# Patient Record
Sex: Male | Born: 1985 | Race: White | Hispanic: No | Marital: Married | Smoking: Current every day smoker
Health system: Southern US, Community
[De-identification: ages and names within clinical notes are randomized; demographics above are authoritative.]

## PROBLEM LIST (undated history)

## (undated) DIAGNOSIS — J45909 Unspecified asthma, uncomplicated: Secondary | ICD-10-CM

## (undated) DIAGNOSIS — F319 Bipolar disorder, unspecified: Secondary | ICD-10-CM

## (undated) HISTORY — PX: GANGLION CYST EXCISION: SHX1691

## (undated) HISTORY — PX: COSMETIC SURGERY: SHX468

## (undated) HISTORY — PX: HERNIA REPAIR: SHX51

---
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
## 2003-12-25 ENCOUNTER — Emergency Department (HOSPITAL_COMMUNITY): Admission: EM | Admit: 2003-12-25 | Discharge: 2003-12-25 | Payer: Self-pay | Admitting: *Deleted

## 2004-05-13 ENCOUNTER — Emergency Department (HOSPITAL_COMMUNITY): Admission: EM | Admit: 2004-05-13 | Discharge: 2004-05-13 | Payer: Self-pay | Admitting: Emergency Medicine

## 2004-05-26 ENCOUNTER — Emergency Department (HOSPITAL_COMMUNITY): Admission: EM | Admit: 2004-05-26 | Discharge: 2004-05-26 | Payer: Self-pay | Admitting: Emergency Medicine

## 2004-06-18 ENCOUNTER — Emergency Department (HOSPITAL_COMMUNITY): Admission: EM | Admit: 2004-06-18 | Discharge: 2004-06-18 | Payer: Self-pay | Admitting: Emergency Medicine

## 2004-06-30 ENCOUNTER — Emergency Department (HOSPITAL_COMMUNITY): Admission: EM | Admit: 2004-06-30 | Discharge: 2004-06-30 | Payer: Self-pay | Admitting: Emergency Medicine

## 2004-07-09 ENCOUNTER — Emergency Department (HOSPITAL_COMMUNITY): Admission: EM | Admit: 2004-07-09 | Discharge: 2004-07-09 | Payer: Self-pay | Admitting: Emergency Medicine

## 2004-12-16 ENCOUNTER — Emergency Department (HOSPITAL_COMMUNITY): Admission: EM | Admit: 2004-12-16 | Discharge: 2004-12-16 | Payer: Self-pay | Admitting: Emergency Medicine

## 2005-11-04 ENCOUNTER — Emergency Department (HOSPITAL_COMMUNITY): Admission: EM | Admit: 2005-11-04 | Discharge: 2005-11-04 | Payer: Self-pay | Admitting: Emergency Medicine

## 2005-11-25 ENCOUNTER — Inpatient Hospital Stay (HOSPITAL_COMMUNITY): Admission: EM | Admit: 2005-11-25 | Discharge: 2005-11-26 | Payer: Self-pay | Admitting: Emergency Medicine

## 2006-03-20 ENCOUNTER — Emergency Department (HOSPITAL_COMMUNITY): Admission: EM | Admit: 2006-03-20 | Discharge: 2006-03-20 | Payer: Self-pay | Admitting: Emergency Medicine

## 2006-08-15 ENCOUNTER — Emergency Department (HOSPITAL_COMMUNITY): Admission: EM | Admit: 2006-08-15 | Discharge: 2006-08-16 | Payer: Self-pay | Admitting: Emergency Medicine

## 2006-09-12 ENCOUNTER — Emergency Department (HOSPITAL_COMMUNITY): Admission: EM | Admit: 2006-09-12 | Discharge: 2006-09-12 | Payer: Self-pay | Admitting: Emergency Medicine

## 2006-09-13 ENCOUNTER — Observation Stay (HOSPITAL_COMMUNITY): Admission: EM | Admit: 2006-09-13 | Discharge: 2006-09-13 | Payer: Self-pay | Admitting: Emergency Medicine

## 2007-06-09 ENCOUNTER — Emergency Department (HOSPITAL_COMMUNITY): Admission: EM | Admit: 2007-06-09 | Discharge: 2007-06-09 | Payer: Self-pay | Admitting: Family Medicine

## 2007-06-28 ENCOUNTER — Emergency Department (HOSPITAL_COMMUNITY): Admission: EM | Admit: 2007-06-28 | Discharge: 2007-06-28 | Payer: Self-pay | Admitting: Emergency Medicine

## 2007-10-07 ENCOUNTER — Emergency Department (HOSPITAL_COMMUNITY): Admission: EM | Admit: 2007-10-07 | Discharge: 2007-10-08 | Payer: Self-pay | Admitting: Emergency Medicine

## 2007-10-09 ENCOUNTER — Emergency Department (HOSPITAL_COMMUNITY): Admission: EM | Admit: 2007-10-09 | Discharge: 2007-10-09 | Payer: Self-pay | Admitting: Emergency Medicine

## 2007-11-10 ENCOUNTER — Emergency Department (HOSPITAL_COMMUNITY): Admission: EM | Admit: 2007-11-10 | Discharge: 2007-11-10 | Payer: Self-pay | Admitting: Emergency Medicine

## 2007-11-29 ENCOUNTER — Emergency Department (HOSPITAL_COMMUNITY): Admission: EM | Admit: 2007-11-29 | Discharge: 2007-11-29 | Payer: Self-pay | Admitting: Emergency Medicine

## 2007-12-13 ENCOUNTER — Emergency Department (HOSPITAL_COMMUNITY): Admission: EM | Admit: 2007-12-13 | Discharge: 2007-12-13 | Payer: Self-pay | Admitting: Emergency Medicine

## 2007-12-30 ENCOUNTER — Encounter: Admission: RE | Admit: 2007-12-30 | Discharge: 2007-12-30 | Payer: Self-pay | Admitting: Internal Medicine

## 2008-10-04 ENCOUNTER — Emergency Department (HOSPITAL_COMMUNITY): Admission: EM | Admit: 2008-10-04 | Discharge: 2008-10-04 | Payer: Self-pay | Admitting: Family Medicine

## 2009-01-04 ENCOUNTER — Emergency Department (HOSPITAL_COMMUNITY): Admission: EM | Admit: 2009-01-04 | Discharge: 2009-01-04 | Payer: Self-pay | Admitting: Emergency Medicine

## 2009-01-09 ENCOUNTER — Emergency Department (HOSPITAL_COMMUNITY): Admission: EM | Admit: 2009-01-09 | Discharge: 2009-01-09 | Payer: Self-pay | Admitting: Family Medicine

## 2009-01-11 ENCOUNTER — Emergency Department (HOSPITAL_COMMUNITY): Admission: EM | Admit: 2009-01-11 | Discharge: 2009-01-11 | Payer: Self-pay | Admitting: Emergency Medicine

## 2009-04-06 ENCOUNTER — Emergency Department (HOSPITAL_COMMUNITY): Admission: EM | Admit: 2009-04-06 | Discharge: 2009-04-06 | Payer: Self-pay | Admitting: Emergency Medicine

## 2009-06-29 ENCOUNTER — Emergency Department (HOSPITAL_COMMUNITY): Admission: EM | Admit: 2009-06-29 | Discharge: 2009-06-29 | Payer: Self-pay | Admitting: Family Medicine

## 2009-07-02 ENCOUNTER — Emergency Department (HOSPITAL_COMMUNITY): Admission: EM | Admit: 2009-07-02 | Discharge: 2009-07-02 | Payer: Self-pay | Admitting: Emergency Medicine

## 2009-11-27 ENCOUNTER — Emergency Department (HOSPITAL_COMMUNITY): Admission: EM | Admit: 2009-11-27 | Discharge: 2009-11-28 | Payer: Self-pay | Admitting: Emergency Medicine

## 2010-02-21 ENCOUNTER — Emergency Department (HOSPITAL_COMMUNITY): Admission: EM | Admit: 2010-02-21 | Discharge: 2009-08-31 | Payer: Self-pay | Admitting: Emergency Medicine

## 2010-03-14 ENCOUNTER — Emergency Department (HOSPITAL_COMMUNITY)
Admission: EM | Admit: 2010-03-14 | Discharge: 2010-03-14 | Payer: Self-pay | Source: Home / Self Care | Admitting: Emergency Medicine

## 2010-05-30 LAB — URINE CULTURE

## 2010-05-30 LAB — URINE MICROSCOPIC-ADD ON

## 2010-05-30 LAB — URINALYSIS, ROUTINE W REFLEX MICROSCOPIC
Glucose, UA: NEGATIVE mg/dL
Hgb urine dipstick: NEGATIVE
Specific Gravity, Urine: 1.017 (ref 1.005–1.030)
Urobilinogen, UA: 1 mg/dL (ref 0.0–1.0)

## 2010-06-04 LAB — POCT URINALYSIS DIP (DEVICE)
Glucose, UA: NEGATIVE mg/dL
Nitrite: NEGATIVE
Protein, ur: 30 mg/dL — AB
Urobilinogen, UA: 0.2 mg/dL (ref 0.0–1.0)

## 2010-06-04 LAB — GC/CHLAMYDIA PROBE AMP, GENITAL: Chlamydia, DNA Probe: NEGATIVE
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
## 2010-08-02 NOTE — Op Note (Signed)
[place]  Patient:    Christopher [name], MIERZWA Visit Number: [id number] MRN: [id number]          Service Type: DSU Location: [facility] Attending Physician:  Marlowe Shores Dictated by:   Artist Pais Mina Marble, M.D. Proc. Date: 04/14/01 Admit Date:  04/14/2001                             Operative Report  PREOPERATIVE DIAGNOSES: 1. Right wrist lunotriquetral interosseous ligament tear. 2. Right wrist dorsal ganglion cyst.  POSTOPERATIVE DIAGNOSES: 1. Right wrist lunotriquetral interosseous ligament tear. 2. Right wrist dorsal ganglion cyst.  PROCEDURE: 1. Right wrist arthroscopy with debridement of lunotriquetral    interosseous ligament tear. 2. Open cyst excision ulnar aspect dorsal wrist.  SURGEON:  Artist Pais. Mina Marble, M.D.  ASSISTANT:  Junius Roads. Ireton, P.A.C.  ANESTHESIA:  General.  TOURNIQUET TIME:  40 minutes.  COMPLICATIONS:  None.  DRAINS:  None.  OPERATIVE REPORT:  The patient was taken to the operating room.  After induction of adequate general anesthesia the right upper extremity was prepped and draped in the usual sterile fashion.  An Esmarch bandage was used to exsanguinate the limb.  The tourniquet was inflated to 250 mmHg at this point and time.  The right hand and wrist were placed in a Concept retraction chair well-padded with 12 pounds of counter traction placed across the radial carpal joint.  Once this was done, the standard 3/4 arthroscopic portal was established 1 cm distal to Listers tubercle.  Blunt dissection was carried down through the skin and the capsule was pierced bluntly.  The scope was inserted in the radial carpal joint.  Visualization of the radial side of the joint revealed intact radioscaphocapitate long radial ligaments and intact scaphointerosseous ligament and fat pad.  At this point and time, the ulnar side of this was visualized using 18 gauge needle and outflow portal was established in the 6U  position with the 18 gauge needle.  A 4/5 working portal was then established under direct vision.  Visualization of the ulnar side of the wrist revealed intact TFCC complex but obvious lunotriquetrial interosseous ligament tear and ulnar synovitis.  Next, using combination of suction shaver, 2.9 mm and the arthrocare wand, an ulnar synovectomy and L3 debridement was undertaken.  Once this was done, the rest of the joint was visualized and partial radial synovectomy was performed. The instruments were then removed from the wrist and a 3 cm incision was made over the dorsal ulnar aspect of the wrist where a large cyst was encountered overlying the 4th and 5th dorsal compartments.  This was carefully dissected down to the stalk and excised in its entirety.  This wound was then closed with running 3-0 Prolene subcuticular stitch.  The arthroscopic portals were 4-0 Nylon and the patient had Marcaine injected for postoperative pain control.  The patient was then placed in sterile dressing with Xeroform, 4x4 fluffs and volar splint.  The patient tolerated the procedure well and went to the recovery room in stable condition. Dictated by:   Artist Pais Mina Marble, M.D. Attending Physician:  Marlowe Shores DD:  04/14/01 TD:  04/14/01 Job: [id number]

## 2011-01-01 LAB — URINALYSIS, ROUTINE W REFLEX MICROSCOPIC
Glucose, UA: NEGATIVE
Ketones, ur: NEGATIVE
Nitrite: NEGATIVE
Specific Gravity, Urine: 1.006
pH: 6

## 2011-01-01 LAB — I-STAT 8, (EC8 V) (CONVERTED LAB)
Chloride: 105
HCT: 50
Hemoglobin: 17
Operator id: 192351
Potassium: 3.8
Sodium: 140
TCO2: 27

## 2011-01-01 LAB — POCT I-STAT CREATININE
Creatinine, Ser: 0.8
Operator id: 192351

## 2011-01-01 LAB — ETHANOL: Alcohol, Ethyl (B): 115 — ABNORMAL HIGH

## 2011-11-25 DIAGNOSIS — F17200 Nicotine dependence, unspecified, uncomplicated: Secondary | ICD-10-CM | POA: Insufficient documentation

## 2011-11-25 DIAGNOSIS — N390 Urinary tract infection, site not specified: Secondary | ICD-10-CM | POA: Insufficient documentation

## 2011-11-25 DIAGNOSIS — I861 Scrotal varices: Secondary | ICD-10-CM | POA: Insufficient documentation

## 2011-11-25 DIAGNOSIS — F319 Bipolar disorder, unspecified: Secondary | ICD-10-CM | POA: Insufficient documentation

## 2011-11-25 DIAGNOSIS — J45909 Unspecified asthma, uncomplicated: Secondary | ICD-10-CM | POA: Insufficient documentation

## 2011-11-26 ENCOUNTER — Emergency Department (HOSPITAL_COMMUNITY): Payer: Medicaid Other

## 2011-11-26 ENCOUNTER — Encounter (HOSPITAL_COMMUNITY): Payer: Self-pay

## 2011-11-26 ENCOUNTER — Emergency Department (HOSPITAL_COMMUNITY)
Admission: EM | Admit: 2011-11-26 | Discharge: 2011-11-26 | Disposition: A | Discharge status: Home / Self Care | Payer: Medicaid Other | Attending: Emergency Medicine | Admitting: Emergency Medicine

## 2011-11-26 DIAGNOSIS — I861 Scrotal varices: Secondary | ICD-10-CM

## 2011-11-26 DIAGNOSIS — N390 Urinary tract infection, site not specified: Secondary | ICD-10-CM

## 2011-11-26 HISTORY — DX: Bipolar disorder, unspecified: F31.9

## 2011-11-26 HISTORY — DX: Unspecified asthma, uncomplicated: J45.909

## 2011-11-26 LAB — URINALYSIS, MICROSCOPIC ONLY
Bilirubin Urine: NEGATIVE
Hgb urine dipstick: NEGATIVE
Ketones, ur: NEGATIVE mg/dL
Nitrite: NEGATIVE
Urobilinogen, UA: 0.2 mg/dL (ref 0.0–1.0)

## 2011-11-26 LAB — CBC WITH DIFFERENTIAL/PLATELET
Eosinophils Absolute: 0.1 10*3/uL (ref 0.0–0.7)
Hemoglobin: 16.2 g/dL (ref 13.0–17.0)
Lymphocytes Relative: 43 % (ref 12–46)
Lymphs Abs: 4.1 10*3/uL — ABNORMAL HIGH (ref 0.7–4.0)
MCH: 30.2 pg (ref 26.0–34.0)
MCV: 84 fL (ref 78.0–100.0)
Monocytes Relative: 9 % (ref 3–12)
Neutrophils Relative %: 46 % (ref 43–77)
Platelets: 277 10*3/uL (ref 150–400)
RBC: 5.37 MIL/uL (ref 4.22–5.81)
WBC: 9.6 10*3/uL (ref 4.0–10.5)

## 2011-11-26 LAB — BASIC METABOLIC PANEL
BUN: 11 mg/dL (ref 6–23)
CO2: 25 mEq/L (ref 19–32)
Glucose, Bld: 84 mg/dL (ref 70–99)
Potassium: 3.7 mEq/L (ref 3.5–5.1)
Sodium: 136 mEq/L (ref 135–145)

## 2011-11-26 MED ORDER — ONDANSETRON HCL 4 MG/2ML IJ SOLN: 4.0000 mg | Freq: Once | INTRAMUSCULAR | Status: DC

## 2011-11-26 MED ORDER — MORPHINE SULFATE 4 MG/ML IJ SOLN: 4.0000 mg | Freq: Once | INTRAMUSCULAR | Status: DC

## 2011-11-26 MED ORDER — OXYCODONE-ACETAMINOPHEN 5-325 MG PO TABS
2.0000 | ORAL_TABLET | Freq: Once | ORAL | Status: AC
  Administered 2011-11-26: 2 via ORAL
  Filled 2011-11-26: qty 2

## 2011-11-26 MED ORDER — HYDROCODONE-ACETAMINOPHEN 5-500 MG PO TABS: 1.0000 | ORAL_TABLET | Freq: Four times a day (QID) | ORAL | Status: AC | PRN

## 2011-11-26 MED ORDER — CIPROFLOXACIN HCL 500 MG PO TABS: 500.0000 mg | ORAL_TABLET | Freq: Two times a day (BID) | ORAL | Status: AC

## 2011-11-26 MED ORDER — ONDANSETRON 4 MG PO TBDP
ORAL_TABLET | ORAL | Status: AC
  Administered 2011-11-26: 4 mg via ORAL
  Filled 2011-11-26: qty 1

## 2011-11-26 MED ORDER — ONDANSETRON 4 MG PO TBDP
4.0000 mg | ORAL_TABLET | Freq: Once | ORAL | Status: AC
  Administered 2011-11-26: 4 mg via ORAL

## 2011-11-26 NOTE — ED Provider Notes (Signed)
History     CSN: [id number]  Arrival date & time 11/25/11  2354   First MD Initiated Contact with Patient 11/26/11 0358      Chief Complaint  Patient presents with  . Abdominal Pain    (Consider location/radiation/quality/duration/timing/severity/associated sxs/prior treatment) HPI  Patient presents to the emergency department with complaints of pain to his left scrotum for the past week. He states that he normally doesn't feel his testicles very much but they have been hurting her recently. He denies having any change in color or feeling any masses. He states that his left lower quadrant hurts a little bit as well. He denies having any nausea vomiting, diarrhea, fevers, weakness, chills. He denies having this problem in the past. Pt hemodynamically stable.  Past Medical History  Diagnosis Date  . Asthma   . Bipolar 1 disorder     Past Surgical History  Procedure Date  . Hernia repair   . Ganglion cyst excision   . Cosmetic surgery     face    History reviewed. No pertinent family history.  History  Substance Use Topics  . Smoking status: Current Every Day Smoker -- 1.0 packs/day  . Smokeless tobacco: Not on file  . Alcohol Use: No      Review of Systems   Review of Systems  Gen: no weight loss, fevers, chills, night sweats  Eyes: no discharge or drainage, no occular pain or visual changes  Nose: no epistaxis or rhinorrhea  Mouth: no dental pain, no sore throat  Neck: no neck pain  Lungs:No wheezing, coughing or hemoptysis CV: no chest pain, palpitations, dependent edema or orthopnea  Abd: + LLQ abdominal pain, - nausea, vomiting  GU: no dysuria or gross hematuria, pain to the testicles MSK:  No abnormalities  Neuro: no headache, no focal neurologic deficits  Skin: no abnormalities Psyche: negative.    Allergies  Review of patient's allergies indicates no known allergies.  Home Medications   Current Outpatient Rx  Name Route Sig Dispense Refill    . HYDROCODONE-ACETAMINOPHEN 5-500 MG PO TABS Oral Take 1-2 tablets by mouth every 6 (six) hours as needed for pain. 15 tablet 0    BP 122/68  Pulse 68  Resp 18  SpO2 98%  Physical Exam  Nursing note and vitals reviewed. Constitutional: He appears well-developed and well-nourished. No distress.  HENT:  Head: Normocephalic and atraumatic.  Eyes: Pupils are equal, round, and reactive to light.  Neck: Normal range of motion. Neck supple.  Cardiovascular: Normal rate and regular rhythm.   Pulmonary/Chest: Effort normal.  Abdominal: Soft. He exhibits no distension and no mass. There is tenderness (mild suprapubic and LLQ tenderness). There is no rebound and no guarding.  Neurological: He is alert.  Skin: Skin is warm and dry.    ED Course  Procedures (including critical care time)  Labs Reviewed  URINALYSIS, WITH MICROSCOPIC - Abnormal; Notable for the following:    APPearance HAZY (*)     Leukocytes, UA MODERATE (*)     Bacteria, UA FEW (*)     All other components within normal limits  CBC WITH DIFFERENTIAL - Abnormal; Notable for the following:    Lymphs Abs 4.1 (*)     All other components within normal limits  BASIC METABOLIC PANEL  URINE CULTURE   US Scrotum  11/26/2011  *RADIOLOGY REPORT*  Clinical Data:  Left testicular pain for 1 week.  No trauma or fevers.  SCROTAL ULTRASOUND DOPPLER ULTRASOUND OF THE TESTICLES  Technique: Complete ultrasound examination of the testicles, epididymis, and other scrotal structures was performed.  Color and spectral Doppler ultrasound were also utilized to evaluate blood flow to the testicles.  Comparison:  None.  Findings:  Right testis:  The right testis measures 5 x 3.1 x 3.2 cm.  Normal homogeneous parenchymal echotexture.  Small focal calcification. Color-flow Doppler images demonstrate flow within the testis.  No focal mass lesion.  Left testis:  The left testis measures 4.3 x 2.4 x 1.6 cm.  Normal homogeneous parenchymal echotexture.   Color flow Doppler images demonstrate flow within the testis.  No focal mass lesion.  Right epididymis:  Normal in size and appearance.  Left epididymis:  Normal in size and appearance.  Hydrocele:  Bilateral scrotal hydroceles.  Varicocele:  Left scrotal varicoceles is present.  Pulsed Doppler interrogation of both testes demonstrates low resistance flow bilaterally.  IMPRESSION: No testicular mass or torsion identified.  Bilateral scrotal hydroceles.  Left scrotal varicoceles.   Original Report Authenticated By: Marlon Pel, M.D.    [place] Art/ven Flow Abd Pelv Doppler  11/26/2011  *RADIOLOGY REPORT*  Clinical Data:  Left testicular pain for 1 week.  No trauma or fevers.  SCROTAL ULTRASOUND DOPPLER ULTRASOUND OF THE TESTICLES  Technique: Complete ultrasound examination of the testicles, epididymis, and other scrotal structures was performed.  Color and spectral Doppler ultrasound were also utilized to evaluate blood flow to the testicles.  Comparison:  None.  Findings:  Right testis:  The right testis measures 5 x 3.1 x 3.2 cm.  Normal homogeneous parenchymal echotexture.  Small focal calcification. Color-flow Doppler images demonstrate flow within the testis.  No focal mass lesion.  Left testis:  The left testis measures 4.3 x 2.4 x 1.6 cm.  Normal homogeneous parenchymal echotexture.  Color flow Doppler images demonstrate flow within the testis.  No focal mass lesion.  Right epididymis:  Normal in size and appearance.  Left epididymis:  Normal in size and appearance.  Hydrocele:  Bilateral scrotal hydroceles.  Varicocele:  Left scrotal varicoceles is present.  Pulsed Doppler interrogation of both testes demonstrates low resistance flow bilaterally.  IMPRESSION: No testicular mass or torsion identified.  Bilateral scrotal hydroceles.  Left scrotal varicoceles.   Original Report Authenticated By: Marlon Pel, M.D.      1. UTI (lower urinary tract infection)   2. Left varicocele       MDM    Concerned that the patient may have epididymitis. A scrotal ultrasound has been ordered with Dopplers. If the ultrasound comes back negative I think that the patient may need a abdominal CT scan to evaluate for possible kidney stone or other intraabdominal pathology.  Pt does have UTI and positive ultrasound for varicocele. Treatment is symptomatic. Referral to urology.         Dorthula Matas, PA 11/26/11 0558  Dorthula Matas, PA 11/26/11 [phone]

## 2011-11-26 NOTE — ED Provider Notes (Signed)
Medical screening examination/treatment/procedure(s) were performed by non-physician practitioner and as supervising physician I was immediately available for consultation/collaboration.  Richardean Canal, MD 11/26/11 0730

## 2011-11-27 LAB — GC/CHLAMYDIA PROBE AMP, URINE: GC Probe Amp, Urine: NEGATIVE

## 2011-11-28 LAB — URINE CULTURE: Colony Count: >=100000

## 2011-11-29 NOTE — ED Notes (Signed)
+  Urine. Patient treated with Cipro. Sensitive to same. Per protocol MD. °

## 2013-05-06 ENCOUNTER — Emergency Department (HOSPITAL_COMMUNITY): Payer: Medicaid Other

## 2013-05-06 ENCOUNTER — Encounter (HOSPITAL_COMMUNITY): Payer: Self-pay | Admitting: Emergency Medicine

## 2013-05-06 ENCOUNTER — Emergency Department (HOSPITAL_COMMUNITY)
Admission: EM | Admit: 2013-05-06 | Discharge: 2013-05-07 | Disposition: A | Discharge status: Home / Self Care | Payer: Medicaid Other | Attending: Emergency Medicine | Admitting: Emergency Medicine

## 2013-05-06 DIAGNOSIS — R05 Cough: Secondary | ICD-10-CM

## 2013-05-06 DIAGNOSIS — R059 Cough, unspecified: Secondary | ICD-10-CM

## 2013-05-06 DIAGNOSIS — Y929 Unspecified place or not applicable: Secondary | ICD-10-CM | POA: Insufficient documentation

## 2013-05-06 DIAGNOSIS — S3120XA Unspecified open wound of penis, initial encounter: Secondary | ICD-10-CM | POA: Insufficient documentation

## 2013-05-06 DIAGNOSIS — F17200 Nicotine dependence, unspecified, uncomplicated: Secondary | ICD-10-CM | POA: Insufficient documentation

## 2013-05-06 DIAGNOSIS — W268XXA Contact with other sharp object(s), not elsewhere classified, initial encounter: Secondary | ICD-10-CM | POA: Insufficient documentation

## 2013-05-06 DIAGNOSIS — Z8659 Personal history of other mental and behavioral disorders: Secondary | ICD-10-CM | POA: Insufficient documentation

## 2013-05-06 DIAGNOSIS — Z23 Encounter for immunization: Secondary | ICD-10-CM | POA: Insufficient documentation

## 2013-05-06 DIAGNOSIS — Y9389 Activity, other specified: Secondary | ICD-10-CM | POA: Insufficient documentation

## 2013-05-06 DIAGNOSIS — J45909 Unspecified asthma, uncomplicated: Secondary | ICD-10-CM | POA: Insufficient documentation

## 2013-05-06 DIAGNOSIS — S3994XA Unspecified injury of external genitals, initial encounter: Secondary | ICD-10-CM

## 2013-05-06 LAB — CBC WITH DIFFERENTIAL/PLATELET
BASOS ABS: 0 10*3/uL (ref 0.0–0.1)
Basophils Relative: 0 % (ref 0–1)
Eosinophils Absolute: 0.2 10*3/uL (ref 0.0–0.7)
Eosinophils Relative: 1 % (ref 0–5)
HCT: 47.5 % (ref 39.0–52.0)
Hemoglobin: 17.4 g/dL — ABNORMAL HIGH (ref 13.0–17.0)
LYMPHS PCT: 15 % (ref 12–46)
Lymphs Abs: 3 10*3/uL (ref 0.7–4.0)
MCH: 31.1 pg (ref 26.0–34.0)
MCHC: 36.6 g/dL — ABNORMAL HIGH (ref 30.0–36.0)
MCV: 85 fL (ref 78.0–100.0)
MONOS PCT: 6 % (ref 3–12)
Monocytes Absolute: 1.2 10*3/uL — ABNORMAL HIGH (ref 0.1–1.0)
NEUTROS PCT: 78 % — AB (ref 43–77)
Neutro Abs: 15.4 10*3/uL — ABNORMAL HIGH (ref 1.7–7.7)
PLATELETS: 271 10*3/uL (ref 150–400)
RBC: 5.59 MIL/uL (ref 4.22–5.81)
RDW: 12.3 % (ref 11.5–15.5)
WBC: 19.8 10*3/uL — AB (ref 4.0–10.5)

## 2013-05-06 LAB — COMPREHENSIVE METABOLIC PANEL
ALBUMIN: 4.5 g/dL (ref 3.5–5.2)
ALK PHOS: 84 U/L (ref 39–117)
ALT: 29 U/L (ref 0–53)
AST: 20 U/L (ref 0–37)
BILIRUBIN TOTAL: 0.6 mg/dL (ref 0.3–1.2)
BUN: 10 mg/dL (ref 6–23)
CHLORIDE: 100 meq/L (ref 96–112)
CO2: 26 mEq/L (ref 19–32)
CREATININE: 0.83 mg/dL (ref 0.50–1.35)
Calcium: 10.7 mg/dL — ABNORMAL HIGH (ref 8.4–10.5)
GFR calc Af Amer: >90 mL/min (ref >90)
GFR calc non Af Amer: >90 mL/min (ref >90)
Glucose, Bld: 103 mg/dL — ABNORMAL HIGH (ref 70–99)
POTASSIUM: 4.5 meq/L (ref 3.7–5.3)
Sodium: 139 mEq/L (ref 137–147)
Total Protein: 7.8 g/dL (ref 6.0–8.3)

## 2013-05-06 LAB — URINALYSIS, ROUTINE W REFLEX MICROSCOPIC
Bilirubin Urine: NEGATIVE
GLUCOSE, UA: NEGATIVE mg/dL
Hgb urine dipstick: NEGATIVE
KETONES UR: NEGATIVE mg/dL
LEUKOCYTES UA: NEGATIVE
Nitrite: NEGATIVE
PH: 6 (ref 5.0–8.0)
Protein, ur: NEGATIVE mg/dL
Specific Gravity, Urine: 1.023 (ref 1.005–1.030)
Urobilinogen, UA: 0.2 mg/dL (ref 0.0–1.0)

## 2013-05-06 LAB — LIPASE, BLOOD: Lipase: 17 U/L (ref 11–59)

## 2013-05-06 MED ORDER — TETANUS-DIPHTH-ACELL PERTUSSIS 5-2.5-18.5 LF-MCG/0.5 IM SUSP
0.5000 mL | Freq: Once | INTRAMUSCULAR | Status: AC
  Administered 2013-05-06: 0.5 mL via INTRAMUSCULAR
  Filled 2013-05-06: qty 0.5

## 2013-05-06 MED ORDER — GUAIFENESIN 100 MG/5ML PO LIQD
100.0000 mg | ORAL | Status: DC | PRN
Start: 2013-05-06 — End: 2013-11-07

## 2013-05-06 NOTE — ED Provider Notes (Signed)
Medical screening examination/treatment/procedure(s) were performed by non-physician practitioner and as supervising physician I was immediately available for consultation/collaboration.  EKG Interpretation   None         Rolan BuccoMelanie Amal Renbarger, MD 05/06/13 2345
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
## 2013-05-06 NOTE — Discharge Instructions (Signed)
Cough, Adult  A cough is a reflex that helps clear your throat and airways. It can help heal the body or may be a reaction to an irritated airway. A cough may only last 2 or 3 weeks (acute) or may last more than 8 weeks (chronic).  CAUSES Acute cough:  Viral or bacterial infections. Chronic cough:  Infections.  Allergies.  Asthma.  Post-nasal drip.  Smoking.  Heartburn or acid reflux.  Some medicines.  Chronic lung problems (COPD).  Cancer. SYMPTOMS   Cough.  Fever.  Chest pain.  Increased breathing rate.  High-pitched whistling sound when breathing (wheezing).  Colored mucus that you cough up (sputum). TREATMENT   A bacterial cough may be treated with antibiotic medicine.  A viral cough must run its course and will not respond to antibiotics.  Your caregiver may recommend other treatments if you have a chronic cough. HOME CARE INSTRUCTIONS   Only take over-the-counter or prescription medicines for pain, discomfort, or fever as directed by your caregiver. Use cough suppressants only as directed by your caregiver.  Use a cold steam vaporizer or humidifier in your bedroom or home to help loosen secretions.  Sleep in a semi-upright position if your cough is worse at night.  Rest as needed.  Stop smoking if you smoke. SEEK IMMEDIATE MEDICAL CARE IF:   You have pus in your sputum.  Your cough starts to worsen.  You cannot control your cough with suppressants and are losing sleep.  You begin coughing up blood.  You have difficulty breathing.  You develop pain which is getting worse or is uncontrolled with medicine.  You have a fever. MAKE SURE YOU:   Understand these instructions.  Will watch your condition.  Will get help right away if you are not doing well or get worse. Document Released: 08/30/2010 Document Revised: 05/26/2011 Document Reviewed: 08/30/2010 [facility] Patient Information 2014 [employer] [place]. Puncture Wound A puncture wound  is an injury that extends through all layers of the skin and into the tissue beneath the skin (subcutaneous tissue). Puncture wounds become infected easily because germs often enter the body and go beneath the skin during the injury. Having a deep wound with a small entrance point makes it difficult for your caregiver to adequately clean the wound. This is especially true if you have stepped on a nail and it has passed through a dirty shoe or other situations where the wound is obviously contaminated. CAUSES  Many puncture wounds involve glass, nails, splinters, fish hooks, or other objects that enter the skin (foreign bodies). A puncture wound may also be caused by a human bite or animal bite. DIAGNOSIS  A puncture wound is usually diagnosed by your history and a physical exam. You may need to have an X-ray or an ultrasound to check for any foreign bodies still in the wound. TREATMENT   Your caregiver will clean the wound as thoroughly as possible. Depending on the location of the wound, a bandage (dressing) may be applied.  Your caregiver might prescribe antibiotic medicines.  You may need a follow-up visit to check on your wound. Follow all instructions as directed by your caregiver. HOME CARE INSTRUCTIONS   Change your dressing once per day, or as directed by your caregiver. If the dressing sticks, it may be removed by soaking the area in water.  If your caregiver has given you follow-up instructions, it is very important that you return for a follow-up appointment. Not following up as directed could result in a  chronic or permanent injury, pain, and disability.  Only take over-the-counter or prescription medicines for pain, discomfort, or fever as directed by your caregiver.  If you are given antibiotics, take them as directed. Finish them even if you start to feel better. You may need a tetanus shot if:  You cannot remember when you had your last tetanus shot.  You have never had a  tetanus shot. If you got a tetanus shot, your arm may swell, get red, and feel warm to the touch. This is common and not a problem. If you need a tetanus shot and you choose not to have one, there is a rare chance of getting tetanus. Sickness from tetanus can be serious. You may need a rabies shot if an animal bite caused your puncture wound. SEEK MEDICAL CARE IF:   You have redness, swelling, or increasing pain in the wound.  You have red streaks going away from the wound.  You notice a bad smell coming from the wound or dressing.  You have yellowish-white fluid (pus) coming from the wound.  You are treated with an antibiotic for infection, but the infection is not getting better.  You notice something in the wound, such as rubber from your shoe, cloth, or another object.  You have a fever.  You have severe pain.  You have difficulty breathing.  You feel dizzy or faint.  You cannot stop vomiting.  You lose feeling, develop numbness, or cannot move a limb below the wound.  Your symptoms worsen. MAKE SURE YOU:  Understand these instructions.  Will watch your condition.  Will get help right away if you are not doing well or get worse. Document Released: 12/11/2004 Document Revised: 05/26/2011 Document Reviewed: 08/20/2010 [facility] Patient Information 2014 [place], [place].

## 2013-11-07 ENCOUNTER — Emergency Department (HOSPITAL_COMMUNITY)
Admission: EM | Admit: 2013-11-07 | Discharge: 2013-11-07 | Disposition: A | Discharge status: Home / Self Care | Payer: Medicaid Other | Attending: Emergency Medicine | Admitting: Emergency Medicine

## 2013-11-07 ENCOUNTER — Encounter (HOSPITAL_COMMUNITY): Payer: Self-pay | Admitting: Emergency Medicine

## 2013-11-07 DIAGNOSIS — K029 Dental caries, unspecified: Secondary | ICD-10-CM | POA: Diagnosis not present

## 2013-11-07 DIAGNOSIS — K0889 Other specified disorders of teeth and supporting structures: Secondary | ICD-10-CM

## 2013-11-07 DIAGNOSIS — K089 Disorder of teeth and supporting structures, unspecified: Secondary | ICD-10-CM | POA: Diagnosis present

## 2013-11-07 DIAGNOSIS — F17200 Nicotine dependence, unspecified, uncomplicated: Secondary | ICD-10-CM | POA: Diagnosis not present

## 2013-11-07 DIAGNOSIS — J45909 Unspecified asthma, uncomplicated: Secondary | ICD-10-CM | POA: Diagnosis not present

## 2013-11-07 MED ORDER — OXYCODONE-ACETAMINOPHEN 5-325 MG PO TABS
1.0000 | ORAL_TABLET | Freq: Once | ORAL | Status: AC
  Administered 2013-11-07: 1 via ORAL
  Filled 2013-11-07: qty 1

## 2013-11-07 MED ORDER — IBUPROFEN 800 MG PO TABS
800.0000 mg | ORAL_TABLET | Freq: Three times a day (TID) | ORAL | Status: DC
Start: 2013-11-07 — End: 2015-03-28

## 2013-11-07 MED ORDER — OXYCODONE-ACETAMINOPHEN 5-325 MG PO TABS: 1.0000 | ORAL_TABLET | ORAL | Status: DC | PRN

## 2013-11-07 MED ORDER — PENICILLIN V POTASSIUM 500 MG PO TABS: 500.0000 mg | ORAL_TABLET | Freq: Four times a day (QID) | ORAL | Status: AC

## 2013-11-07 NOTE — ED Notes (Signed)
Pt reports left upper dental pain x 1 month. States he had follow-up and they were unable to remove tooth. Pt in NAD.

## 2013-11-07 NOTE — Discharge Instructions (Signed)
You have a dental injury. Use the resource guide listed below to help you find a dentist if you do not already have one to followup with. It is very important that you get evaluated by a dentist as soon as possible. Call tomorrow to schedule an appointment. Use your pain medication as prescribed and do not operate heavy machinery while on pain medication. Note that your pain medication contains acetaminophen (Tylenol) & its is not reccommended that you use additional acetaminophen (Tylenol) while taking this medication. Take your full course of antibiotics. Read the instructions below.  Eat a soft or liquid diet and rinse your mouth out after meals with warm water. You should see a dentist or return here at once if you have increased swelling, increased pain or uncontrolled bleeding from the site of your injury.   SEEK MEDICAL CARE IF:   You have increased pain not controlled with medicines.   You have swelling around your tooth, in your face or neck.   You have bleeding which starts, continues, or gets worse.   You have a fever >101  If you are unable to open your mouth  RESOURCE GUIDE  Dental Problems  Patients with Medicaid: [facility]                     [facility] [address]                                           [address] Phone:  [phone]                                                  Phone:  [phone]  If unable to pay or uninsured, contact:  [facility] or [facility] to become qualified for the adult dental clinic.  Chronic Pain Problems Contact [facility]  [phone] Patients need to be referred by their primary care doctor.  Insufficient Money for Medicine Contact [employer]:  call "211" or [facility] 271-5999.  No Primary Care Doctor Call [facility]  832-8000 Other agencies that provide inexpensive medical care    [facility]  832-8035    [place]  [facility]  832-7272    [facility]  [phone]    Women's Clinic  832-4777    [facility]  [phone]    [facility]  [phone]  Psychological Services [facility]  832-9600 [facility]  [phone] [facility]   800 853-5163 (emergency services 641-4993)  Substance Abuse Resources [facility]  336-882-2125 [facility] 336-784-9470 [facility] 336-285-9073 [facility] 336-845-3988 Residential & Outpatient Substance Abuse Program  800-659-3381  Abuse/Neglect [place] Child Abuse Hotline (336) 641-3795 [place] Child Abuse Hotline 800-378-5315 (After Hours)  Emergency Shelter [facility] (336) 271-5985  [facility] (336) 275-9566 [facility] (704) 372-4663  MRSA Hotline #:   832-7006    [place] Resources  [facility] [place]     [employer]                            [facility] [address] [place]                       [address]      [address]  [place]                                                [place]                            [place] Phone:  [phone]                                   Phone:  [phone]                 Phone:  [phone]  [facility] Phone:  [phone]  [place] Child Abuse Hotline (336) 342-1394 (336) 342-3537 (After Hours)    

## 2013-11-07 NOTE — ED Provider Notes (Signed)
CSN: [id number]     Arrival date & time 11/07/13  2003 History   First MD Initiated Contact with Patient 11/07/13 2017     This chart was scribed for non-physician practitioner, Mellody Drown PA-C working with Flint Melter, MD by Arlan Organ, ED Scribe. This patient was seen in room TR07C/TR07C and the patient's care was started at 8:20 PM.   Chief Complaint  Patient presents with  . Dental Pain   HPI Comments: Christopher [name] is a 28 y.o. male who presents to the Emergency Department complaining of L upper dental pain. Pt states pain has been present for approximately 1 month, but has recently worsened in last 2 days. He admits to cracking his tooth approximately 1 month ago. Pain is exacerbated with eating, drinking, wind intolerance, and hot/cold temperature intolerance. The patient was previously evaluated for same dental pain and referred to a dentist. He states he was not evaluated at the time due to insurance issues. He has tried OTC Orajel, BC powder, and Chloraseptic oral spray without any improvement for symptoms. At this time he denies any fever or chills. No known allergies to medications. No other concerns this visit.  The history is provided by the patient. No language interpreter was used.    Past Medical History  Diagnosis Date  . Asthma   . Bipolar 1 disorder    Past Surgical History  Procedure Laterality Date  . Hernia repair    . Ganglion cyst excision    . Cosmetic surgery      face   No family history on file. History  Substance Use Topics  . Smoking status: Current Every Day Smoker -- 1.00 packs/day  . Smokeless tobacco: Not on file  . Alcohol Use: No    Review of Systems  Constitutional: Negative for fever and chills.  HENT: Positive for dental problem. Negative for facial swelling.   All other systems reviewed and are negative.     Allergies  Review of patient's allergies indicates no known allergies.  Home Medications   Prior to Admission  medications   Medication Sig Start Date End Date Taking? Authorizing Provider  guaiFENesin (ROBITUSSIN) 100 MG/5ML liquid Take 5-10 mLs (100-200 mg total) by mouth every 4 (four) hours as needed for cough. 05/06/13   Roxy Horseman, PA-C   Triage Vitals: BP 135/80  Pulse 97  Temp(Src) 98.5 F (36.9 C) (Oral)  Resp 18  SpO2 100%   Physical Exam  Nursing note and vitals reviewed. Constitutional: He is oriented to person, place, and time. He appears well-developed and well-nourished.  Non-toxic appearance. He does not have a sickly appearance. He does not appear ill. No distress.  HENT:  Head: Normocephalic and atraumatic.  Mouth/Throat: Uvula is midline, oropharynx is clear and moist and mucous membranes are normal. Mucous membranes are not dry. No trismus in the jaw. Abnormal dentition. Dental caries present. No dental abscesses. No oropharyngeal exudate, posterior oropharyngeal edema or posterior oropharyngeal erythema.  3/4 tooth #11 absent. Tooth #11 with small hole to the center. No palpable abscess. No signs of peritonsillar or tonsillar abscess. No signs of gingival abscess. Oropharynx is clear and without exudates. Soft non-tender sublingual mucosa, no tongue elevation, no edema to sublingual space, normal voice. Airway patent.  Eyes: EOM are normal. Pupils are equal, round, and reactive to light.  Neck: Normal range of motion. Neck supple.  Pulmonary/Chest: Effort normal. No respiratory distress.  Musculoskeletal: Normal range of motion.  Lymphadenopathy:    He has  no cervical adenopathy.  Neurological: He is alert and oriented to person, place, and time.  Skin: Skin is warm and dry. He is not diaphoretic.  Psychiatric: He has a normal mood and affect. His behavior is normal.    ED Course  Procedures (including critical care time)  DIAGNOSTIC STUDIES:   COORDINATION OF CARE: 8:19 PM-Discussed treatment plan with pt at bedside and pt agreed to plan.     Labs Review Labs  Reviewed - No data to display  Imaging Review No results found.   EKG Interpretation None      MDM   Final diagnoses:  Pain, dental   Patient with toothache, hole in tooth number 11. Concerning for nerve exposure.  No gross abscess.  Exam unconcerning for Ludwig's angina or spread of infection.  Will treat with penicillin and pain medicine.  Urged patient to follow-up with dentist.    Meds given in ED:  Medications  oxyCODONE-acetaminophen (PERCOCET/ROXICET) 5-325 MG per tablet 1 tablet (not administered)    New Prescriptions   IBUPROFEN (ADVIL,MOTRIN) 800 MG TABLET    Take 1 tablet (800 mg total) by mouth 3 (three) times daily.   OXYCODONE-ACETAMINOPHEN (PERCOCET) 5-325 MG PER TABLET    Take 1 tablet by mouth every 4 (four) hours as needed.   PENICILLIN V POTASSIUM (VEETID) 500 MG TABLET    Take 1 tablet (500 mg total) by mouth 4 (four) times daily.   I personally performed the services described in this documentation, which was scribed in my presence. The recorded information has been reviewed and is accurate.    Mellody Drown, PA-C 11/07/13 2114

## 2013-11-07 NOTE — ED Notes (Signed)
Declined W/C at D/C and was escorted to lobby by RN. 

## 2013-11-08 NOTE — ED Provider Notes (Signed)
Medical screening examination/treatment/procedure(s) were performed by non-physician practitioner and as supervising physician I was immediately available for consultation/collaboration.  Flint Melter, MD 11/08/13 [phone]

## 2015-03-25 ENCOUNTER — Emergency Department (HOSPITAL_COMMUNITY): Payer: Medicaid Other

## 2015-03-25 ENCOUNTER — Emergency Department (HOSPITAL_COMMUNITY)
Admission: EM | Admit: 2015-03-25 | Discharge: 2015-03-26 | Disposition: A | Discharge status: Other Acute Inpatient Hospital | Payer: Medicaid Other | Attending: Emergency Medicine | Admitting: Emergency Medicine

## 2015-03-25 ENCOUNTER — Encounter (HOSPITAL_COMMUNITY): Payer: Self-pay | Admitting: Emergency Medicine

## 2015-03-25 DIAGNOSIS — F251 Schizoaffective disorder, depressive type: Secondary | ICD-10-CM | POA: Insufficient documentation

## 2015-03-25 DIAGNOSIS — F419 Anxiety disorder, unspecified: Secondary | ICD-10-CM | POA: Diagnosis not present

## 2015-03-25 DIAGNOSIS — F1220 Cannabis dependence, uncomplicated: Secondary | ICD-10-CM | POA: Diagnosis not present

## 2015-03-25 DIAGNOSIS — R451 Restlessness and agitation: Secondary | ICD-10-CM | POA: Insufficient documentation

## 2015-03-25 DIAGNOSIS — Z79899 Other long term (current) drug therapy: Secondary | ICD-10-CM | POA: Insufficient documentation

## 2015-03-25 DIAGNOSIS — J45909 Unspecified asthma, uncomplicated: Secondary | ICD-10-CM | POA: Insufficient documentation

## 2015-03-25 DIAGNOSIS — Z7982 Long term (current) use of aspirin: Secondary | ICD-10-CM | POA: Insufficient documentation

## 2015-03-25 DIAGNOSIS — F319 Bipolar disorder, unspecified: Secondary | ICD-10-CM | POA: Insufficient documentation

## 2015-03-25 DIAGNOSIS — F17200 Nicotine dependence, unspecified, uncomplicated: Secondary | ICD-10-CM | POA: Insufficient documentation

## 2015-03-25 DIAGNOSIS — R45851 Suicidal ideations: Secondary | ICD-10-CM

## 2015-03-25 LAB — CBC
HEMATOCRIT: 49.4 % (ref 39.0–52.0)
Hemoglobin: 17.3 g/dL — ABNORMAL HIGH (ref 13.0–17.0)
MCH: 30.2 pg (ref 26.0–34.0)
MCHC: 35 g/dL (ref 30.0–36.0)
MCV: 86.4 fL (ref 78.0–100.0)
PLATELETS: 321 10*3/uL (ref 150–400)
RBC: 5.72 MIL/uL (ref 4.22–5.81)
RDW: 12.5 % (ref 11.5–15.5)
WBC: 11.2 10*3/uL — AB (ref 4.0–10.5)

## 2015-03-25 LAB — URINALYSIS, ROUTINE W REFLEX MICROSCOPIC
GLUCOSE, UA: NEGATIVE mg/dL
HGB URINE DIPSTICK: NEGATIVE
KETONES UR: NEGATIVE mg/dL
LEUKOCYTES UA: NEGATIVE
Nitrite: NEGATIVE
PH: 6 (ref 5.0–8.0)
Protein, ur: NEGATIVE mg/dL
Specific Gravity, Urine: 1.03 (ref 1.005–1.030)

## 2015-03-25 LAB — COMPREHENSIVE METABOLIC PANEL
ALBUMIN: 4.9 g/dL (ref 3.5–5.0)
ALK PHOS: 76 U/L (ref 38–126)
ALT: 37 U/L (ref 17–63)
ANION GAP: 12 (ref 5–15)
AST: 23 U/L (ref 15–41)
BUN: 14 mg/dL (ref 6–20)
CHLORIDE: 103 mmol/L (ref 101–111)
CO2: 24 mmol/L (ref 22–32)
Calcium: 10 mg/dL (ref 8.9–10.3)
Creatinine, Ser: 0.87 mg/dL (ref 0.61–1.24)
GFR calc non Af Amer: >60 mL/min (ref >60)
GLUCOSE: 100 mg/dL — AB (ref 65–99)
POTASSIUM: 4.2 mmol/L (ref 3.5–5.1)
SODIUM: 139 mmol/L (ref 135–145)
Total Bilirubin: 0.7 mg/dL (ref 0.3–1.2)
Total Protein: 8 g/dL (ref 6.5–8.1)

## 2015-03-25 LAB — RAPID URINE DRUG SCREEN, HOSP PERFORMED
AMPHETAMINES: NOT DETECTED
BARBITURATES: NOT DETECTED
BENZODIAZEPINES: NOT DETECTED
COCAINE: NOT DETECTED
Opiates: NOT DETECTED
TETRAHYDROCANNABINOL: POSITIVE — AB

## 2015-03-25 LAB — SALICYLATE LEVEL

## 2015-03-25 LAB — ACETAMINOPHEN LEVEL

## 2015-03-25 LAB — ETHANOL

## 2015-03-25 LAB — URINE MICROSCOPIC-ADD ON
RBC / HPF: NONE SEEN RBC/hpf (ref 0–5)
Squamous Epithelial / LPF: NONE SEEN
WBC, UA: NONE SEEN WBC/hpf (ref 0–5)

## 2015-03-25 MED ORDER — OLANZAPINE 5 MG PO TBDP: 5.0000 mg | ORAL_TABLET | Freq: Every day | ORAL | Status: DC

## 2015-03-25 MED ORDER — LORAZEPAM 1 MG PO TABS
1.0000 mg | ORAL_TABLET | Freq: Once | ORAL | Status: DC
  Administered 2015-03-25: 1 mg via ORAL
  Filled 2015-03-25: qty 1

## 2015-03-25 MED ORDER — HYDROXYZINE HCL 25 MG PO TABS
25.0000 mg | ORAL_TABLET | Freq: Three times a day (TID) | ORAL | Status: DC | PRN
  Administered 2015-03-26: 25 mg via ORAL
  Filled 2015-03-25: qty 1

## 2015-03-25 MED ORDER — OLANZAPINE 5 MG PO TABS
5.0000 mg | ORAL_TABLET | Freq: Once | ORAL | Status: AC
  Administered 2015-03-25: 5 mg via ORAL
  Filled 2015-03-25: qty 1

## 2015-03-25 MED ORDER — FLUOXETINE HCL 10 MG PO CAPS
10.0000 mg | ORAL_CAPSULE | Freq: Every day | ORAL | Status: DC
Start: 2015-03-25 — End: 2015-03-26
  Administered 2015-03-25 – 2015-03-26 (×2): 10 mg via ORAL
  Filled 2015-03-25 (×2): qty 1

## 2015-03-25 MED ORDER — TRAZODONE HCL 100 MG PO TABS: 100.0000 mg | ORAL_TABLET | Freq: Every evening | ORAL | Status: DC | PRN

## 2015-03-25 MED ORDER — ASENAPINE MALEATE 5 MG SL SUBL
10.0000 mg | SUBLINGUAL_TABLET | Freq: Every day | SUBLINGUAL | Status: DC
  Administered 2015-03-25: 10 mg via SUBLINGUAL
  Filled 2015-03-25 (×2): qty 2

## 2015-03-25 NOTE — Progress Notes (Signed)
Disposition CSW completed patient referrals to the following inpatient psych facilities:  [facility]  CSW will continue to follow patient for placement needs.  Seward SpeckLeo Clarisa Danser [facility] CenterCSW,LCAS Behavioral Health Disposition CSW [phone]

## 2015-03-25 NOTE — ED Notes (Signed)
Presented by [employer] ,pt. Is here voluntary for medical clearance, pt. Stated that he have been experiencing multiple problems like drugs ,alcohol and depressions, pt. Stated that he once attempted to commit suicide and even thoughts of hurting others but DENIED SI today, here for help. Pt. Is anxious but cooperative.

## 2015-03-25 NOTE — ED Notes (Signed)
tts in w/ pt 

## 2015-03-25 NOTE — ED Provider Notes (Signed)
CSN: [id number]     Arrival date & time 03/25/15  0549 History   First MD Initiated Contact with Patient 03/25/15 [phone]     Chief Complaint  Patient presents with  . Medical Clearance     (Consider location/radiation/quality/duration/timing/severity/associated sxs/prior Treatment) HPI   Christopher [name] is a 30 y.o. male with PMH significant for asthma and bipolar disorder 1 who presents with constant, moderate, progressively worsening feelings of increased agitation and feeling depressed for "a while".  Patient reports he has been dealing with this his whole life, but recently it has been causing problems in his marriage with his wife threatening divorce.  He reports his best friend recently died from a drug overdose and he has been in a dark place.  He does report he has thoughts of ending his life, but denies a plan.  He denies SI or HI at present.  He reports he has been self medicating with marijuana; however, that doesn't provide any relief now.  He does use tobacco; 1 ppd.  No aggravating factors.  He has no complaints at this time.  Denies CP, SOB, abdominal pain, or urinary symptoms.   Past Medical History  Diagnosis Date  . Asthma   . Bipolar 1 disorder [facility])    Past Surgical History  Procedure Laterality Date  . Hernia repair    . Ganglion cyst excision    . Cosmetic surgery      face   History reviewed. No pertinent family history. Social History  Substance Use Topics  . Smoking status: Current Every Day Smoker -- 1.00 packs/day  . Smokeless tobacco: None  . Alcohol Use: No    Review of Systems All other systems negative unless otherwise stated in HPI    Allergies  Abilify; Depakote er; Seroquel; and Wellbutrin  Home Medications   Prior to Admission medications   Medication Sig Start Date End Date Taking? Authorizing Provider  albuterol (VENTOLIN HFA) 108 (90 Base) MCG/ACT inhaler Inhale 1-2 puffs into the lungs every 6 (six) hours as needed for wheezing or  shortness of breath.   Yes Historical Provider, MD  aspirin 81 MG tablet Take 81 mg by mouth daily as needed for pain.   Yes Historical Provider, MD  ibuprofen (ADVIL,MOTRIN) 800 MG tablet Take 1 tablet (800 mg total) by mouth 3 (three) times daily. Patient not taking: Reported on 03/25/2015 11/07/13   Mellody Drown, PA-C  oxyCODONE-acetaminophen (PERCOCET) 5-325 MG per tablet Take 1 tablet by mouth every 4 (four) hours as needed. Patient not taking: Reported on 03/25/2015 11/07/13   Mellody Drown, PA-C   BP 128/88 mmHg  Pulse 90  Temp(Src) 98.1 F (36.7 C) (Oral)  Resp 20  SpO2 99% Physical Exam  Constitutional: He is oriented to person, place, and time. He appears well-developed and well-nourished.  HENT:  Head: Normocephalic and atraumatic.  Mouth/Throat: Oropharynx is clear and moist.  Eyes: Conjunctivae are normal. Pupils are equal, round, and reactive to light.  Neck: Normal range of motion. Neck supple.  Cardiovascular: Normal rate, regular rhythm and normal heart sounds.   No murmur heard. Pulmonary/Chest: Effort normal and breath sounds normal. No accessory muscle usage or stridor. No respiratory distress. He has no wheezes. He has no rhonchi. He has no rales.  Abdominal: Soft. Bowel sounds are normal. He exhibits no distension. There is no tenderness.  Musculoskeletal: Normal range of motion.  Lymphadenopathy:    He has no cervical adenopathy.  Neurological: He is alert and oriented to person,  place, and time.  Speech clear without dysarthria.  Skin: Skin is warm and dry.  Psychiatric: His mood appears anxious. He is agitated. He exhibits a depressed mood. He expresses no homicidal and no suicidal ideation. He expresses no suicidal plans and no homicidal plans.    ED Course  Procedures (including critical care time) Labs Review Labs Reviewed  COMPREHENSIVE METABOLIC PANEL - Abnormal; Notable for the following:    Glucose, Bld 100 (*)    All other components within normal  limits  URINE RAPID DRUG SCREEN, HOSP PERFORMED - Abnormal; Notable for the following:    Tetrahydrocannabinol POSITIVE (*)    All other components within normal limits  CBC - Abnormal; Notable for the following:    WBC 11.2 (*)    Hemoglobin 17.3 (*)    All other components within normal limits  ACETAMINOPHEN LEVEL - Abnormal; Notable for the following:    Acetaminophen (Tylenol), Serum <10 (*)    All other components within normal limits  URINALYSIS, ROUTINE W REFLEX MICROSCOPIC (NOT AT [facility]) - Abnormal; Notable for the following:    Color, Urine AMBER (*)    APPearance TURBID (*)    Bilirubin Urine SMALL (*)    All other components within normal limits  URINE MICROSCOPIC-ADD ON - Abnormal; Notable for the following:    Bacteria, UA FEW (*)    All other components within normal limits  ETHANOL  SALICYLATE LEVEL    Imaging Review Dg Chest 2 View  03/25/2015  CLINICAL DATA:  Medical clearance.  Initial encounter. EXAM: CHEST  2 VIEW COMPARISON:  Chest radiograph performed 05/06/2013 FINDINGS: The lungs are well-aerated and clear. There is no evidence of focal opacification, pleural effusion or pneumothorax. The heart is normal in size; the mediastinal contour is within normal limits. No acute osseous abnormalities are seen. IMPRESSION: No acute cardiopulmonary process seen. Electronically Signed   By: Roanna RaiderJeffery  Chang M.D.   On: 03/25/2015 06:49   I have personally reviewed and evaluated these images and lab results as part of my medical decision-making.   EKG Interpretation None      MDM   Final diagnoses:  Agitation  Anxiety    Patient presents with increased feelings of agitation and feeling depressed.  Denies SI or HI today. Otherwise, no complaints.   VSS, NAD.  On exam, heart RRR, lungs CTAB, abdomen soft and benign.  Will obtain labs.  Ativan for anxiety. CMP, CBC unremarkable.  Ethanol, salicylate, and acetaminophen negative.  UDS positive for THC.  CXR unremarkable.   EKG NSR.  UA negative.    8:20 AM: patient became very agitated and states he wants to hit someone while NT attempted EKG.  Will give zyprexa.  Will consult TTS and [facility].    Cheri FowlerKayla Lazariah Savard, PA-C 03/25/15 1207  Gilda Creasehristopher J Pollina, MD 03/26/15 [phone]

## 2015-03-25 NOTE — ED Notes (Signed)
Pt ambulatory w/o difficulty to room 36 

## 2015-03-25 NOTE — ED Notes (Signed)
Pt's wife (Diane) called and  Reports that a good friend of his died around thanksgiving from a heroin overdose and that he began having problems after that.  She reports that he has increased depression expressing that he wants to die, having increased and more frequent anger out bursts, but has not been violent.  She is requesting that he be placed long term so that he can get stablized on the medications and any potential side effects from them.  Will relay request toTTS.

## 2015-03-25 NOTE — Discharge Instructions (Signed)
°Emergency Department Resource Guide °1) Find a Doctor and Pay Out of Pocket °Although you won't have to find out who is covered by your insurance plan, it is a good idea to ask around and get recommendations. You will then need to call the office and see if the doctor you have chosen will accept you as a new patient and what types of options they offer for patients who are self-pay. Some doctors offer discounts or will set up payment plans for their patients who do not have insurance, but you will need to ask so you aren't surprised when you get to your appointment. ° °2) Contact Your Local Health Department °Not all health departments have doctors that can see patients for sick visits, but many do, so it is worth a call to see if yours does. If you don't know where your local health department is, you can check in your phone book. The CDC also has a tool to help you locate your state's health department, and many state websites also have listings of all of their local health departments. ° °3) Find a Walk-in Clinic °If your illness is not likely to be very severe or complicated, you may want to try a walk in clinic. These are popping up all over the country in pharmacies, drugstores, and shopping centers. They're usually staffed by nurse practitioners or physician assistants that have been trained to treat common illnesses and complaints. They're usually fairly quick and inexpensive. However, if you have serious medical issues or chronic medical problems, these are probably not your best option. ° °No Primary Care Doctor: °- Call [facility] at  832-8000 - they can help you locate a primary care doctor that  accepts your insurance, provides certain services, etc. °- Physician Referral Service- 1-800-533-3463 ° °Chronic Pain Problems: °Organization         Address  Phone   Notes  °[facility]  (336) 297-2271 Patients need to be referred by their primary care doctor.  ° °Medication  Assistance: °Organization         Address  Phone   Notes  °[place] [facility] [address] °[place], [place] [address] (336) 641-8030 --Must be a resident of [place] °-- Must have NO insurance coverage whatsoever (no Medicaid/ Medicare, etc.) °-- The pt. MUST have a primary care doctor that directs their care regularly and follows them in the community °  °[facility]  (866) 331-1348   °[employer]  (888) 892-1162   ° °Agencies that provide inexpensive medical care: °Organization         Address  Phone   Notes  °[facility]  (336) 832-8035   °[facility]    (336) 832-7272   °[facility] [address] °[place], [place] [address] (336) 832-4777   °[facility] [address], °[place] (336) 271-4999   °[facility]    (336) 373-0678   °[facility]    (336) 272-1050   °[facility] ° [address], [place] Phone:  (336) 832-4444, Fax:  (336) 832-4440 Hours of Operation:  9 am - 6 pm, M-F.  Also accepts Medicaid/Medicare and self-pay.  °[facility] ° [address],  Phone: (336) 832-3150, Fax: (336) 832-3151. Hours of Operation:  8:30 am - 5:30 pm, M-F.  Also accepts Medicaid and self-pay.  °[facility] [place] [address], [place] Phone: (336) 878-6027   °[facility] [address], [place], [place] (336)723-1848, Ext. 123 Mondays & Thursdays: 7-9 AM.  First 15 patients are seen on a first come, first serve basis. °  ° °Medicaid-accepting [place] Providers: ° °Organization         Address  Phone   Notes  °[facility] [address], [place] (336) 641-2100 Also accepts self-pay patients.  °[facility] [address], [place] ° (336) 856-9996   °[facility] [address], [place]  (336) 288-8857   °[facility] [address] [place] [address], [place] (336) 299-7000   °Veita Bland [address], [place]  ° (336) 373-1557 Only accepts [place] Access Medicaid patients after they have their name applied to their card.  ° °Self-Pay (no insurance) in [place]: ° °Organization         Address  Phone   Notes  °Sickle Cell Patients, [facility] [address], [place] (336) 832-1970   °[facility] [address], [place] (336) 832-4400   °[facility] ° [address], [place] (336) 992-4800   °[facility]/Dr. Osei-Bonsu ° [address] [place] [address], [place] or [address], [place] (336) [phone] Phone number for both [place] and [place] locations is the same.  °[facility] [address], [place] (336) 299-0000   °[facility] [address] [place] [address], [place] or [address] (336) 852-7530 °(336) 878-2260   °[facility] [address], [place] (336) 350-1642, phone; (336) 294-5005, fax Sees patients 1st and 3rd Saturday of every month.  Must not qualify for public or private insurance (i.e. Medicaid, Medicare, [place] Health Choice, Veterans' Benefits) • Household income should be no more than 200% of the poverty level •The clinic cannot treat you if you are pregnant or think you are pregnant • Sexually transmitted diseases are not treated at the clinic.  ° ° °Dental Care: °Organization         Address  Phone  Notes  °[place] [facility] [address], [place] (336) 641-6152 Accepts children up to age 21 who are enrolled in Medicaid or [place] Health Choice; pregnant women with a Medicaid card; and children who have applied for Medicaid or [place] Health Choice, but were declined, whose parents can pay a reduced fee at time of service.  °[place]  [facility] [place]  [address], [place] (336) 641-7733 Accepts children up to age 21 who are enrolled in Medicaid or [place] Health Choice; pregnant women with a Medicaid card; and children who have applied for Medicaid or  Health Choice, but were declined, whose parents can pay a reduced fee at time of service.  °[facility] ° [address],  (336) 641-4533 Patients are seen by appointment only. Walk-ins are not accepted. [facility] will see patients 18 years of age and older. °Monday - Tuesday (8am-5pm) °Most Wednesdays (8:30-5pm) °$30 per visit, cash only  °[facility] ° [address], [place] (336) 641-4533 Patients are seen by appointment only. Walk-ins are not accepted. [facility] will see patients 18 years of age and older. °One   Wednesday Evening (Monthly: Volunteer Based).  $30 per visit, cash only  °[facility]  (919) 537-3737 for adults; Children under age 4, call Graduate Pediatric Dentistry at (919) [phone]. Children aged 4-14, please call (919) [phone] to request a pediatric application. ° Dental services are provided in all areas of dental care including fillings, crowns and bridges, complete and partial dentures, implants, gum treatment, root canals, and extractions. Preventive care is also provided. Treatment is provided to both adults and children. °Patients are selected via a lottery and there is often a waiting list. °  °[facility] [address], °[place] ° (336) 763-8833 [email or web address] °  °[facility] [address], [place], [place] (336)723-1848, Ext. 123 Second and Fourth Thursday of each month, opens at 6:30 AM; Clinic ends at 9 AM.  Patients are seen on a first-come first-served basis, and a limited number are seen during each clinic.  ° °[facility] ° [address], [place], [place] (336) 723-7904    Eligibility Requirements °You must have lived in [place], [place], or [place] counties for at least the last three months. °  You cannot be eligible for state or federal sponsored healthcare insurance, including Veterans Administration, Medicaid, or Medicare. °  You generally cannot be eligible for healthcare insurance through your employer.  °  How to apply: °Eligibility screenings are held every Tuesday and Wednesday afternoon from 1:00 pm until 4:00 pm. You do not need an appointment for the interview!  °[facility] [address], [place], [place] 336-631-2330   °[facility] [facility]  336-342-8273   °[facility]  336-703-3100   °[facility]  336-570-6415   ° °Behavioral Health Resources in the Community: °Intensive Outpatient Programs °Organization         Address  Phone  Notes  °[facility] [address], [place], [place] 336-878-6098   °[facility] [address], [place], [place] 336-832-9800   °[facility] [address], [place], [place] ° 336-882-2125   °[facility] [address],  °[place], [place] 1-800-853-5163 or 336-641-4981   °Substance Abuse Resources °Organization         Address  Phone  Notes  °[facility]  336-882-2125   °[facility]  336-784-9470   °[facility]  336-285-9073   °[facility]  336-845-3988   °Residential & Outpatient Substance Abuse Program  1-800-659-3381   °Psychological Services °Organization         Address  Phone  Notes  °[facility]  336- 832-9600   °[facility]  336- 378-7881   °[facility] [address], [place] 1-800-853-5163 or 336-641-4981   ° °Mobile Crisis Teams °Organization         Address  Phone  Notes  °[facility], Mobile Crisis Care Unit  1-877-626-1772   °Assertive °[facility] ° [address]  [place], [place] 336-834-9664   °Sharon DeEsch [address] °[place] 336-554-5454   ° °Self-Help/Support Groups °Organization         Address  Phone             Notes  °[facility] [place] - variety of support groups  336- 373-1402 Call for more information  °Narcotics Anonymous (NA), [facility] [address], °[place]  2 meetings at this location  ° °  Residential Treatment Programs °Organization         Address  Phone  Notes  °[facility] [address],    °[place]  1-866-801-8205   °[facility] ° [address], [place], [place] 704-293-8524   °[facility] [address], [place] 336-845-3988 Admissions: 8am-3pm M-F  °[facility] [address]    °[place], [place] 336-841-1104   °[facility] [address], [place], [place] 336-379-7146   °[facility] [address]  °[place], [place] 336-285-9073   °[facility] - Intensive Outpatient [address], [place], [place] 336-852-3033   °[facility] [address]  °[place], [place] 1-877-615-2722 or 336-784-9470   °[facility] [address] [place], [place] 336-227-7417 Accepts Medicaid  °[facility] [address]  °[place] 1-800-659-3381 Substance Abuse/Addiction Treatment  ° °[place] Behavioral Health Resources °Organization         Address  Phone  Notes  °[facility]  (888) 581-9988   °Julie Brannon, PhD [address] [place], [place]   (336) 349-5553 or (336) 951-0000   °[facility]   [address] °[place], [place] (336) 349-4454   °[facility] [address], [place], [place] (336) 342-8316 Insurance/Medicaid/sponsorship through [facility]  °[facility] [address]                                    [place], [place] (336) 342-8316 Therapy/tele-psych/case    °[facility] [address]  ° [place], [place] (336) 349-2233    °Dr. Arfeen  (336) 349-4544   °[facility] [place]  [employer] [place] [facility] 1) [address], [place] °2) [address], [place] °3)  [address], [place] (336) 349-3220 °(336) 342-7768 ° °(336) 342-8140   °[place] Child Abuse [facility] (336) 342-1394 or (336) 342-3537 (After Hours)    ° ° °
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
## 2015-03-25 NOTE — ED Notes (Signed)
Attempted to do ekg pt stood up started yalling saying he was angry and felt like he wanted to hit someone. PA Dorathy Daftkayla and MD Fayrene Fearingjames made aware,

## 2015-03-25 NOTE — ED Notes (Signed)
Pt. Noted sleeping in room. No complaints or concerns voiced. No distress or abnormal behavior noted. Will continue to monitor with security cameras. Q 15 minute rounds continue. 

## 2015-03-25 NOTE — ED Notes (Signed)
Dr a and jamison into see 

## 2015-03-25 NOTE — ED Notes (Signed)
Report received from Lizbeth BarkJanie Rambo RN. Pt. Alert and oriented in no distress reluctantly denies SI, HI, AVH and pain.  Pt. Instructed to come to me with problems or concerns.Will continue to monitor for safety via security cameras and Q 15 minute checks.

## 2015-03-25 NOTE — ED Notes (Signed)
Christopher NottinghamJamison DNP aware that pt is sleeping, give prozac when he wakes up

## 2015-03-25 NOTE — ED Notes (Signed)
On the phone 

## 2015-03-26 ENCOUNTER — Encounter (HOSPITAL_COMMUNITY): Payer: Self-pay

## 2015-03-26 ENCOUNTER — Inpatient Hospital Stay (HOSPITAL_COMMUNITY)
Admission: AD | Admit: 2015-03-26 | Discharge: 2015-03-28 | DRG: 885 | Disposition: A | Discharge status: Home / Self Care | Payer: Medicaid Other | Source: Intra-hospital | Attending: Psychiatry | Admitting: Psychiatry

## 2015-03-26 DIAGNOSIS — F3160 Bipolar disorder, current episode mixed, unspecified: Principal | ICD-10-CM | POA: Diagnosis present

## 2015-03-26 DIAGNOSIS — F250 Schizoaffective disorder, bipolar type: Secondary | ICD-10-CM | POA: Diagnosis present

## 2015-03-26 DIAGNOSIS — G4700 Insomnia, unspecified: Secondary | ICD-10-CM | POA: Diagnosis present

## 2015-03-26 DIAGNOSIS — F329 Major depressive disorder, single episode, unspecified: Secondary | ICD-10-CM | POA: Diagnosis present

## 2015-03-26 DIAGNOSIS — F1220 Cannabis dependence, uncomplicated: Secondary | ICD-10-CM | POA: Diagnosis present

## 2015-03-26 DIAGNOSIS — F419 Anxiety disorder, unspecified: Secondary | ICD-10-CM | POA: Diagnosis present

## 2015-03-26 DIAGNOSIS — F17210 Nicotine dependence, cigarettes, uncomplicated: Secondary | ICD-10-CM | POA: Diagnosis present

## 2015-03-26 MED ORDER — ACETAMINOPHEN 325 MG PO TABS: 650.0000 mg | ORAL_TABLET | Freq: Four times a day (QID) | ORAL | Status: DC | PRN

## 2015-03-26 MED ORDER — TRAZODONE HCL 100 MG PO TABS
100.0000 mg | ORAL_TABLET | Freq: Every evening | ORAL | Status: DC | PRN
  Administered 2015-03-26 – 2015-03-27 (×2): 100 mg via ORAL
  Filled 2015-03-26 (×3): qty 1

## 2015-03-26 MED ORDER — MAGNESIUM HYDROXIDE 400 MG/5ML PO SUSP: 30.0000 mL | Freq: Every day | ORAL | Status: DC | PRN

## 2015-03-26 MED ORDER — ASENAPINE MALEATE 5 MG SL SUBL
10.0000 mg | SUBLINGUAL_TABLET | Freq: Every day | SUBLINGUAL | Status: DC
  Administered 2015-03-26 – 2015-03-27 (×2): 10 mg via SUBLINGUAL
  Filled 2015-03-26 (×6): qty 2

## 2015-03-26 MED ORDER — FLUOXETINE HCL 10 MG PO CAPS
10.0000 mg | ORAL_CAPSULE | Freq: Every day | ORAL | Status: DC
Start: 2015-03-27 — End: 2015-03-28
  Administered 2015-03-27 – 2015-03-28 (×2): 10 mg via ORAL
  Filled 2015-03-26 (×5): qty 1

## 2015-03-26 MED ORDER — HYDROXYZINE HCL 25 MG PO TABS: 25.0000 mg | ORAL_TABLET | Freq: Three times a day (TID) | ORAL | Status: DC | PRN

## 2015-03-26 MED ORDER — ALUM & MAG HYDROXIDE-SIMETH 200-200-20 MG/5ML PO SUSP: 30.0000 mL | ORAL | Status: DC | PRN

## 2015-03-26 NOTE — ED Notes (Signed)
Pt. Noted in hall. No complaints or concerns voiced. No distress or abnormal behavior noted. Will continue to monitor with security cameras. Q 15 minute rounds continue. 

## 2015-03-26 NOTE — ED Notes (Signed)
Pt. Noted sleeping in room. No complaints or concerns voiced. No distress or abnormal behavior noted. Will continue to monitor with security cameras. Q 15 minute rounds continue. 

## 2015-03-26 NOTE — Progress Notes (Signed)
Adult Psychoeducational Group Note  Date:  03/26/2015 Time:  9:41 PM  Group Topic/Focus:  Wrap-Up Group:   The focus of this group is to help patients review their daily goal of treatment and discuss progress on daily workbooks.  Participation Level:  Active  Participation Quality:  Appropriate and Attentive  Affect:  Appropriate  Cognitive:  Appropriate  Insight: Appropriate  Engagement in Group:  Engaged  Modes of Intervention:  Discussion  Additional Comments:  Pt stated he learned something about himself today, that his bipolar d/o will never go away. Pt sated his goal for tomorrow is to keep his diet up because he knows that can effect his d/o and to get back on his meds.  Caswell CorwinOwen, Gloriana Piltz C 03/26/2015, 9:41 PM

## 2015-03-26 NOTE — BH Assessment (Signed)
[facility] Assessment Progress Note  Reassessed pt who was sitting on his bed in his room. He states, "I am feeling better, but my eyesight is blurry". He states he slept ok and he wants to get help and get his meds right.  Pt appeared alert and was oriented x4.

## 2015-03-26 NOTE — Progress Notes (Addendum)
Christopher [name] was admitted to room 302-2.  He was very agitated and irritable.  He reported that he used to take depakote but didn't like taking it because "it made my stomach hurt and I have been off the medication for about 10-11 months."  He stated that he didn't try to kill himself today and "you have violated my privacy by me staying here."  "I want to sign the early release paperwork."  Offered for him to sign the paperwork when he arrives on the unit.  He refused to sign paperwork until he read the patients bill of rights form, offered to let him read the bill of rights.  He reviewed the bill of rights paperwork then became upset because writer didn't offer to read it to him.  Again offered to read the paperwork but he became more upset.  Attempted to ask more questions and review admission paperwork.  He became verbally aggressive and demanded to see the "person in charge."  Security notified and [facility] AC notified.  Belongings searched and secured in locker 41 (boots, 2 hoodies, black belt, wallet, keys and lighter).  Q 15 minute checks maintained for safety.  Encouraged participation in group and unit activities.

## 2015-03-26 NOTE — Tx Team (Signed)
Initial Interdisciplinary Treatment Plan   PATIENT STRESSORS: Loss of best friend from OD Substance abuse   PATIENT STRENGTHS: Capable of independent living General fund of knowledge   PROBLEM LIST: Problem List/Patient Goals Date to be addressed Date deferred Reason deferred Estimated date of resolution  Depression 03/26/15     Substance abuse 03/26/15     "I just want to sign my early release paperwork" 03/26/15     Pt refuses to answer any more questions-pt very agitated) 03/26/15                                    DISCHARGE CRITERIA:  Improved stabilization in mood, thinking, and/or behavior Motivation to continue treatment in a less acute level of care  PRELIMINARY DISCHARGE PLAN: Attend aftercare/continuing care group Outpatient therapy  PATIENT/FAMIILY INVOLVEMENT: This treatment plan has been presented to and reviewed with the patient, Leota JacobsenDevin Viswanathan.  The patient and family have been given the opportunity to ask questions and make suggestions.  Norm ParcelHeather V Nov[employer] League 03/26/2015, 6:11 PM

## 2015-03-26 NOTE — ED Notes (Signed)
Patient up pacing the hallway.  Became agitated when another patient started acting out.  He states, "I don't like the way he looks at me.  I'm staying in my room so I won't hurt him."  Patient states he has been off his medication and just want to get stabilized.  He denies SI/HI/AVH.

## 2015-03-26 NOTE — ED Notes (Signed)
Patient visiting with spouse.

## 2015-03-26 NOTE — ED Notes (Signed)
Reviewed admission to [facility] with patient.  He received all personal belongings.[facility]  [facility], RN.  Patient understood all information given and was ready to be transported.  He was transported without incident via [facility] transportation.

## 2015-03-26 NOTE — BH Assessment (Signed)
[facility] Assessment Progress Note  Per Thedore MinsMojeed Akintayo, MD, this pt requires psychiatric hospitalization at this time.  Christopher Heinrichina Tate, RN, [facility] has assigned pt to Rm 302-1.  Pt has signed Voluntary Admission and Consent for Treatment, as well as Consent to Release Information to his wife, and signed forms [facility].  Pt's nurse, Christopher [name], has been notified, and agrees to send original paperwork along with pt via [facility], and to call report to [phone].  Doylene Canninghomas Won Kreuzer, MA Triage Specialist [phone]

## 2015-03-27 ENCOUNTER — Encounter (HOSPITAL_COMMUNITY): Payer: Self-pay | Admitting: Psychiatry

## 2015-03-27 DIAGNOSIS — F3160 Bipolar disorder, current episode mixed, unspecified: Secondary | ICD-10-CM | POA: Diagnosis present

## 2015-03-27 DIAGNOSIS — F1220 Cannabis dependence, uncomplicated: Secondary | ICD-10-CM

## 2015-03-27 NOTE — [facility] Group Notes (Signed)
[facility] LCSW Group Therapy  03/27/2015 1:21 PM  Type of Therapy:  Group Therapy  Participation Level:  Active  Participation Quality:  Attentive  Affect:  Appropriate  Cognitive:  Alert and Oriented  Insight:  Limited  Engagement in Therapy:  Limited  Modes of Intervention:  Discussion, Education, Exploration, Problem-solving, Rapport Building, Socialization and Support  Summary of Progress/Problems: MHA Speaker came to talk about his personal journey with substance abuse and addiction. The pt processed ways by which to relate to the speaker. MHA speaker provided handouts and educational information pertaining to groups and services offered by the [facility].   Smart, Muntaha Vermette LCSW 03/27/2015, 1:21 PM
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
## 2015-03-27 NOTE — Progress Notes (Signed)
D: Pt has animated affect and anxious mood.  Pt reports he is "doing good" and this goal tonight is to "set more goals."  Pt states "I'm missing my wife."  Pt denies SI/HI, denies hallucinations, denies pain.  Pt has been visible in milieu interacting with peers.  He is hyper at times, but redirectable. Pt attended evening group.   A: Introduced self to pt.  Met with pt 1:1 and provided support and encouragement.  Actively listened to pt.  Medications administered per order.  PRN medication administered for sleep. R: Pt is compliant with medications.  Pt verbally contracts for safety.  Will continue to monitor and assess.
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
## 2015-03-27 NOTE — [facility] Suicide Risk Assessment (Signed)
[facility] Ad[facility]icide [facility]   Nursing information obtained from:    Demographic factors:    Current Mental Status:    Loss Factors:    Historical Factors:    Risk Reduction Factors:    Total Time spent with patient: 45 minutes Principal Problem: Bipolar I disorder, most recent episode mixed (HCC) Diagnosis:   Patient Active Problem List   Diagnosis Date Noted  . Bipolar I disorder, most recent episode mixed (HCC) [F31.60] 03/27/2015  . Cannabis use disorder, moderate, dependence (HCC) [F12.20] 03/25/2015  . Anxiety [F41.9]      Continued Clinical Symptoms:  Alcohol Use Disorder Identification Test Final Score (AUDIT): 0 The "Alcohol Use Disorders Identification Test", Guidelines for Use in Primary Care, Second Edition.  World Science writerHealth Organization [facility](WHO). Score between 0-7:  no or low risk or alcohol related problems. Score between 8-15:  moderate risk of alcohol related problems. Score between 16-19:  high risk of alcohol related problems. Score 20 or above:  warrants further diagnostic evaluation for alcohol dependence and treatment.   CLINICAL FACTORS:   Bipolar Disorder:   Mixed State Alcohol/Substance Abuse/Dependencies   Psychiatric Specialty Exam: Physical Exam  ROS  Blood pressure 121/81, pulse 92, temperature 98.1 F (36.7 C), temperature source Oral, resp. rate 18, height 5\' 7"  (1.702 m), weight 74.39 kg (164 lb), SpO2 97 %.Body mass index is 25.68 kg/(m^2).    COGNITIVE FEATURES THAT CONTRIBUTE TO RISK:  Closed-mindedness, Polarized thinking and Thought constriction (tunnel vision)    SUICIDE RISK:   Moderate:  Frequent suicidal ideation with limited intensity, and duration, some specificity in terms of plans, no associated intent, good self-control, limited dysphoria/symptomatology, some risk factors present, and identifiable protective factors, including available and accessible social support.  PLAN OF CARE: see admission H and P  Medical Decision  Making:  Review of Psycho-Social Stressors (1), Review or order clinical lab tests (1), Review of Medication Regimen & Side Effects (2) and Review of New Medication or Change in Dosage (2)  I certify that inpatient services furnished can reasonably be expected to improve the patient's condition.   Rozalynn Buege A 03/27/2015, 5:52 PM

## 2015-03-27 NOTE — Progress Notes (Signed)
Adult Psychoeducational Group Note  Date:  03/27/2015 Time:  10:08 PM  Group Topic/Focus:  Wrap-Up Group:   The focus of this group is to help patients review their daily goal of treatment and discuss progress on daily workbooks.  Participation Level:  Active  Participation Quality:  Appropriate  Affect:  Appropriate  Cognitive:  Alert  Insight: Appropriate  Engagement in Group:  Engaged  Modes of Intervention:  Discussion  Additional Comments:  Patient goal for today was to talk with the doctor. Patient goal for tomorrow is to talk with doctor about his discharge plan. Patient rated his day a 8-81/2 because he received good news.   Margret Moat L Wilson Sample 03/27/2015, 10:08 PM

## 2015-03-27 NOTE — Plan of Care (Signed)
Problem: Alteration in mood Goal: LTG-Patient reports reduction in suicidal thoughts (Patient reports reduction in suicidal thoughts and is able to verbalize a safety plan for whenever patient is feeling suicidal)  Outcome: Progressing Pt denies SI and verbally contracts for safety.       
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
## 2015-03-27 NOTE — Progress Notes (Signed)
Patient ID: [name], male   DOB: 03/14/1986, 30 y.o.   MRN: [id number] D:Affect is appropriate to mood. States that he is working on trying to stay more positive and says he is working on" better ways to cope with things". A:Support and encouragement offered. R:Receptive. No complaints of pain or problems at this time.

## 2015-03-27 NOTE — Plan of Care (Signed)
Problem: Alteration in mood & ability to function due to Goal: LTG-Pt reports reduction in suicidal thoughts (Patient reports reduction in suicidal thoughts and is able to verbalize a safety plan for whenever patient is feeling suicidal)  Outcome: Progressing Pt denies SI and verbally contracts for safety.       

## 2015-03-28 MED ORDER — HYDROXYZINE HCL 25 MG PO TABS: 25.0000 mg | ORAL_TABLET | Freq: Three times a day (TID) | ORAL | Status: AC | PRN

## 2015-03-28 MED ORDER — CARBAMAZEPINE ER 100 MG PO TB12: 100.0000 mg | ORAL_TABLET | Freq: Two times a day (BID) | ORAL | Status: AC

## 2015-03-28 MED ORDER — CARBAMAZEPINE ER 100 MG PO TB12
100.0000 mg | ORAL_TABLET | Freq: Two times a day (BID) | ORAL | Status: DC
  Filled 2015-03-28 (×5): qty 1

## 2015-03-28 MED ORDER — TRAZODONE HCL 100 MG PO TABS: 100.0000 mg | ORAL_TABLET | Freq: Every evening | ORAL | Status: AC | PRN

## 2015-03-28 MED ORDER — ASENAPINE MALEATE 5 MG SL SUBL: 10.0000 mg | SUBLINGUAL_TABLET | Freq: Every day | SUBLINGUAL | Status: AC

## 2015-03-28 MED ORDER — ALBUTEROL SULFATE HFA 108 (90 BASE) MCG/ACT IN AERS: 1.0000 | INHALATION_SPRAY | Freq: Four times a day (QID) | RESPIRATORY_TRACT | Status: AC | PRN

## 2015-03-28 MED ORDER — FLUOXETINE HCL 10 MG PO CAPS: 10.0000 mg | ORAL_CAPSULE | Freq: Every day | ORAL | Status: AC

## 2015-03-28 NOTE — Progress Notes (Signed)
Patient ID: Christopher JacobsenDevin [name], male   DOB: November 03, 1985, 30 y.o.   MRN: [id number]  Pt. Denies SI/HI and A/V hallucinations. Belongings returned to patient at time of discharge. Patient denies any pain or discomfort. Discharge instructions and medications were reviewed with patient. Patient verbalized understanding of both medications and discharge instructions. Q15 minute safety checks maintained until discharge. No distress upon discharge.

## 2015-03-28 NOTE — [facility] Suicide Risk Assessment (Signed)
[facility] INPATIENT:  Family/Significant Other Suicide Prevention Education  Suicide Prevention Education:  Education Completed; Christopher [name] (pt's wife) [phone] has been identified by the patient as the family member/significant other with whom the patient will be residing, and identified as the person(s) who will aid the patient in the event of a mental health crisis (suicidal ideations/suicide attempt).  With written consent from the patient, the family member/significant other has been provided the following suicide prevention education, prior to the and/or following the discharge of the patient.  The suicide prevention education provided includes the following:  Suicide risk factors  Suicide prevention and interventions  National Suicide Hotline telephone number  [facility] assessment telephone number  [facility] Emergency Assistance 911  [facility] and/or Residential Mobile Crisis Unit telephone number  Request made of family/significant other to:  Remove weapons (e.g., guns, rifles, knives), all items previously/currently identified as safety concern.    Remove drugs/medications (over-the-counter, prescriptions, illicit drugs), all items previously/currently identified as a safety concern.  The family member/significant other verbalizes understanding of the suicide prevention education information provided.  The family member/significant other agrees to remove the items of safety concern listed above.  Smart, Lacee Grey LCSW 03/28/2015, 12:57 PM

## 2015-03-28 NOTE — Plan of Care (Signed)
Problem: Alteration in mood & ability to function due to Goal: STG-Patient will attend groups Outcome: Progressing Pt attended evening group on 03/27/15

## 2015-03-28 NOTE — [facility] Group Notes (Signed)
[facility]   03/28/2015 10:55 AM  Participation Quality:  Appropriate   Mood/Affect:  Appropriate  Depression Rating:  0  Anxiety Rating:  0  Thoughts of Suicide:  No Will you contract for safety?   NA  Current AVH:  No  Plan for Discharge/Comments:  Pt reports that he slept good and is hoping to d/c by Thursday. He states that he misses his kids but understands that he needs to be monitored on his meds today. Pt denies any withdrawals. He plans to return home with family and [facility].   Transportation Means: wife  Supports: wife and kids  Smart, ByronHeather LCSW

## 2015-03-28 NOTE — [facility] Suicide Risk Assessment (Signed)
[facility] Discharge Suicide Risk Assessment   Demographic Factors:  Male and Caucasian  Total Time spent with patient: 30 minutes  Musculoskeletal: Strength & Muscle Tone: within normal limits Gait & Station: normal Patient leans: normal  Psychiatric Specialty Exam: Physical Exam  Review of Systems  Constitutional: Negative.   HENT: Negative.   Eyes: Negative.   Respiratory: Negative.   Cardiovascular: Negative.   Gastrointestinal: Negative.   Genitourinary: Negative.   Musculoskeletal: Negative.   Skin: Negative.   Neurological: Negative.   Endo/Heme/Allergies: Negative.   Psychiatric/Behavioral: The patient is nervous/anxious.     Blood pressure 121/78, pulse 83, temperature 97.5 F (36.4 C), temperature source Oral, resp. rate 16, height 5\' 7"  (1.702 m), weight 74.39 kg (164 lb), SpO2 97 %.Body mass index is 25.68 kg/(m^2).  General Appearance: Fairly Groomed  Patent attorney::  Fair  Speech:  Clear and Coherent409  Volume:  Normal  Mood:  Euthymic  Affect:  Appropriate  Thought Process:  Coherent and Goal Directed  Orientation:  Full (Time, Place, and Person)  Thought Content:  plans as he moves on  Suicidal Thoughts:  No  Homicidal Thoughts:  No  Memory:  Immediate;   Fair Recent;   Fair Remote;   Fair  Judgement:  Fair  Insight:  Present  Psychomotor Activity:  Normal  Concentration:  Fair  Recall:  [employer] of Knowledge:Fair  Language: Fair  Akathisia:  No  Handed:  Right  AIMS (if indicated):     Assets:  Desire for Improvement Housing Social Support  Sleep:  Number of Hours: 6.75  Cognition: WNL  ADL's:  Intact   Have you used any form of tobacco in the last 30 days? (Cigarettes, Smokeless Tobacco, Cigars, and/or Pipes): Yes  Has this patient used any form of tobacco in the last 30 days? (Cigarettes, Smokeless Tobacco, Cigars, and/or Pipes) Yes, A prescription for an FDA-approved tobacco cessation medication was offered at discharge and the patient  refused  Mental Status Per Nursing Assessment::   On Admission:     Current Mental Status by Physician: In full contact with reality. There are no active SI plans or intent. He states he is really ready to go home. His wife stated that their daughter is really depressed crying as he is not there. There are also some other issues going on with her parents being placed on a nursing home and his wife needing support. He states he is feeling much better. He has tolerated the medications well and is willing to follow up outpatient basis   Loss Factors: NA  Historical Factors: NA  Risk Reduction Factors:   Sense of responsibility to family, Living with another person, especially a relative and Positive social support  Continued Clinical Symptoms:  Bipolar Disorder:   Mixed State  Cognitive Features That Contribute To Risk:  Closed-mindedness, Polarized thinking and Thought constriction (tunnel vision)    Suicide Risk:  Minimal: No identifiable suicidal ideation.  Patients presenting with no risk factors but with morbid ruminations; may be classified as minimal risk based on the severity of the depressive symptoms  Principal Problem: Bipolar I disorder, most recent episode mixed [facility]) Discharge Diagnoses:  Patient Active Problem List   Diagnosis Date Noted  . Bipolar I disorder, most recent episode mixed (HCC) [F31.60] 03/27/2015  . Cannabis use disorder, moderate, dependence (HCC) [F12.20] 03/25/2015  . Anxiety [F41.9]     Follow-up Information    Follow up with [facility].   Why:  Referral sent in today 03/28/15. Please contact agency at discharge to check status of referral (for medication management and counseling).    Contact information:   [address] [place], [place] [address] Phone: [phone] Fax: [phone]      Plan Of Care/Follow-up recommendations:  Activity:  as tolerated Diet:  regular Follow up as above Is patient on  multiple antipsychotic therapies at discharge:  No   Has Patient had three or more failed trials of antipsychotic monotherapy by history:  No  Recommended Plan for Multiple Antipsychotic Therapies: NA    Xochitl Egle A 03/28/2015, 1:30 PM

## 2015-03-28 NOTE — Progress Notes (Signed)
D: Pt has anxious affect and pleasant mood.  He reports his day was "pretty good."  Pt reports his goal was to "stay positive and I did that."  Pt reports "I did that despite looking at the devil in the face."  When asked for clarification, pt reports he was referring to himself.  He reported that sometimes he feels like the devil.  Pt denies SI/HI, denies hallucinations, denies pain.  Pt has been visible in milieu interacting with peers and staff appropriately.  Pt attended evening group.   A:  Met with pt and offered support and encouragement.  Actively listened to pt.  Medications administered per order.  PRN medication administered for sleep. R: Pt is compliant with medications.  Pt verbally contracts for safety.  Will continue to monitor and assess.
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
## 2015-03-28 NOTE — Progress Notes (Signed)
Recreation Therapy Notes  Date: 01.11.2017  Time: 9:30am Location: [address] Group Room   Group Topic: Stress Management  Goal Area(s) Addresses:  Patient will actively participate in stress management techniques presented during session.   Behavioral Response: Appropriate, Engaged.   Intervention: Stress management techniques  Activity :  Deep Breathing, Progressive Muscle Relaxation andGuided Imagery. LRT provided instruction and demonstration on practice of Progressive Muscle Relaxation and Guided Imagery. Technique was coupled with deep breathing.   Education:  Stress Management, Discharge Planning.   Education Outcome: Acknowledges education  Clinical Observations/Feedback: Patient actively engaged in technique introduced, expressed no concerns and demonstrated ability to practice independently post d/c.   Marykay Lexenise L Buckley Bradly, LRT/CTRS  Jearl KlinefelterBlanchfield, Tramond Slinker L 03/28/2015 6:26 PM

## 2015-03-28 NOTE — Progress Notes (Signed)
  [facility] Discharge Plan :  Will you be returning to the same living situation after discharge:  Yes,  home At discharge, do you have transportation home?: Yes,  wife coming at 2pm Do you have the ability to pay for your medications: Yes,  [facility]  Release of information consent forms completed and submitted to medical records by CSW.  Patient to Follow up at: Follow-up Information    [facility].   Why:  Referral sent in today 03/28/15. Please contact agency at discharge to check status of referral (for medication management and counseling).    Contact information:   [address] [place], [place] [address] Phone: [phone] Fax: [phone]      Next level of care provider has access to [facility] Link:no  Safety Planning and Suicide Prevention discussed: Yes,  with pt's wife. SPI pamphlet and mobile crisis information provided to pt.   Have you used any form of tobacco in the last 30 days? (Cigarettes, Smokeless Tobacco, Cigars, and/or Pipes): Yes  Has patient been referred to the Quitline?: Patient refused referral  Patient has been referred for addiction treatment: n/a   Smart, Wave Calzada LCSW 03/28/2015, 1:04 PM

## 2016-12-16 ENCOUNTER — Encounter (HOSPITAL_COMMUNITY): Payer: Self-pay

## 2016-12-16 ENCOUNTER — Emergency Department (HOSPITAL_COMMUNITY): Payer: Medicaid Other

## 2016-12-16 DIAGNOSIS — J45909 Unspecified asthma, uncomplicated: Secondary | ICD-10-CM | POA: Insufficient documentation

## 2016-12-16 DIAGNOSIS — S0990XA Unspecified injury of head, initial encounter: Secondary | ICD-10-CM | POA: Diagnosis present

## 2016-12-16 DIAGNOSIS — Z23 Encounter for immunization: Secondary | ICD-10-CM | POA: Insufficient documentation

## 2016-12-16 DIAGNOSIS — Y929 Unspecified place or not applicable: Secondary | ICD-10-CM | POA: Insufficient documentation

## 2016-12-16 DIAGNOSIS — F17200 Nicotine dependence, unspecified, uncomplicated: Secondary | ICD-10-CM | POA: Insufficient documentation

## 2016-12-16 DIAGNOSIS — S0181XA Laceration without foreign body of other part of head, initial encounter: Secondary | ICD-10-CM | POA: Diagnosis not present

## 2016-12-16 DIAGNOSIS — Y939 Activity, unspecified: Secondary | ICD-10-CM | POA: Insufficient documentation

## 2016-12-16 DIAGNOSIS — Z79899 Other long term (current) drug therapy: Secondary | ICD-10-CM | POA: Insufficient documentation

## 2016-12-16 DIAGNOSIS — Y999 Unspecified external cause status: Secondary | ICD-10-CM | POA: Insufficient documentation
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
## 2016-12-17 ENCOUNTER — Emergency Department (HOSPITAL_COMMUNITY)
Admission: EM | Admit: 2016-12-17 | Discharge: 2016-12-17 | Disposition: A | Discharge status: Home / Self Care | Payer: Medicaid Other | Attending: Emergency Medicine | Admitting: Emergency Medicine

## 2016-12-17 DIAGNOSIS — S0181XA Laceration without foreign body of other part of head, initial encounter: Secondary | ICD-10-CM

## 2016-12-17 MED ORDER — TETANUS-DIPHTH-ACELL PERTUSSIS 5-2.5-18.5 LF-MCG/0.5 IM SUSP
0.5000 mL | Freq: Once | INTRAMUSCULAR | Status: AC
  Administered 2016-12-17: 0.5 mL via INTRAMUSCULAR
  Filled 2016-12-17: qty 0.5

## 2016-12-17 MED ORDER — LIDOCAINE HCL (PF) 1 % IJ SOLN
5.0000 mL | Freq: Once | INTRAMUSCULAR | Status: AC
  Administered 2016-12-17: 5 mL via INTRADERMAL
  Filled 2016-12-17: qty 5
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
## 2016-12-17 NOTE — ED Notes (Signed)
Pt called for in lobby for vital signs reassessment. No answer.

## 2016-12-17 NOTE — Discharge Instructions (Signed)
Follow-up with urgent care for suture removal in 7-10 days.  Try to keep clean and dry in the meantime. You can call Dr. Earlie Server office to see if there is anything she can do about scarring. Return here for any acute changes.

## 2016-12-17 NOTE — ED Notes (Signed)
Pt called for room x 1, no answer.  

## 2016-12-17 NOTE — ED Notes (Signed)
ED Provider at bedside. 

## 2016-12-17 NOTE — ED Notes (Signed)
Suture cart at bedside 

## 2017-06-18 ENCOUNTER — Emergency Department (HOSPITAL_COMMUNITY)
Admission: EM | Admit: 2017-06-18 | Discharge: 2017-06-18 | Disposition: A | Discharge status: Home / Self Care | Payer: Medicaid Other | Attending: Emergency Medicine | Admitting: Emergency Medicine

## 2017-06-18 ENCOUNTER — Emergency Department (HOSPITAL_COMMUNITY): Payer: Medicaid Other

## 2017-06-18 ENCOUNTER — Encounter (HOSPITAL_COMMUNITY): Payer: Self-pay

## 2017-06-18 DIAGNOSIS — Z79899 Other long term (current) drug therapy: Secondary | ICD-10-CM | POA: Insufficient documentation

## 2017-06-18 DIAGNOSIS — R509 Fever, unspecified: Secondary | ICD-10-CM | POA: Diagnosis not present

## 2017-06-18 DIAGNOSIS — N50811 Right testicular pain: Secondary | ICD-10-CM | POA: Diagnosis not present

## 2017-06-18 DIAGNOSIS — N50819 Testicular pain, unspecified: Secondary | ICD-10-CM

## 2017-06-18 DIAGNOSIS — F17210 Nicotine dependence, cigarettes, uncomplicated: Secondary | ICD-10-CM | POA: Insufficient documentation

## 2017-06-18 DIAGNOSIS — F1210 Cannabis abuse, uncomplicated: Secondary | ICD-10-CM | POA: Diagnosis not present

## 2017-06-18 DIAGNOSIS — J45909 Unspecified asthma, uncomplicated: Secondary | ICD-10-CM | POA: Diagnosis not present

## 2017-06-18 LAB — URINALYSIS, ROUTINE W REFLEX MICROSCOPIC
BILIRUBIN URINE: NEGATIVE
Bacteria, UA: NONE SEEN
Glucose, UA: NEGATIVE mg/dL
KETONES UR: NEGATIVE mg/dL
NITRITE: NEGATIVE
PH: 6 (ref 5.0–8.0)
Protein, ur: NEGATIVE mg/dL
SPECIFIC GRAVITY, URINE: 1.02 (ref 1.005–1.030)

## 2017-06-18 MED ORDER — CEFTRIAXONE SODIUM 250 MG IJ SOLR
250.0000 mg | Freq: Once | INTRAMUSCULAR | Status: AC
  Administered 2017-06-18: 250 mg via INTRAMUSCULAR
  Filled 2017-06-18: qty 250

## 2017-06-18 MED ORDER — AZITHROMYCIN 250 MG PO TABS
1000.0000 mg | ORAL_TABLET | Freq: Once | ORAL | Status: AC
  Administered 2017-06-18: 1000 mg via ORAL
  Filled 2017-06-18: qty 4

## 2017-06-18 MED ORDER — STERILE WATER FOR INJECTION IJ SOLN
INTRAMUSCULAR | Status: AC
  Administered 2017-06-18: 1.2 mL
  Filled 2017-06-18: qty 10

## 2017-06-18 NOTE — ED Triage Notes (Signed)
Pt presents for evaluation of testicular pain x 4 days. States previously had torsion.

## 2017-06-18 NOTE — ED Provider Notes (Signed)
[facility] Provider Note   CSN: [id number] Arrival date & time: 06/18/17  1111   History   Chief Complaint Chief Complaint  Patient presents with  . Testicle Pain    HPI Christopher [name] is a 32 y.o. male.  HPI  [age] [id number] presents today with complaint of right testicular pain times 4 days.  Patient denies any swelling or edema, reports symptoms feel better with underwear.  Patient reports he is sexually active and is concerned that his significant other has an STD.  Patient reports subjective fever yesterday, none today.  He denies any dysuria or penile discharge.  The left testicular pain.  Denies any trauma to the testicles.  Denies any abdominal pain.  Past Medical History:  Diagnosis Date  . Asthma   . Bipolar 1 disorder [facility](HCC)     Patient Active Problem List   Diagnosis Date Noted  . Bipolar I disorder, most recent episode mixed (HCC) 03/27/2015  . Cannabis use disorder, moderate, dependence (HCC) 03/25/2015  . Anxiety     Past Surgical History:  Procedure Laterality Date  . COSMETIC SURGERY     face  . GANGLION CYST EXCISION    . HERNIA REPAIR          Home Medications    Prior to Admission medications   Medication Sig Start Date End Date Taking? Authorizing Provider  albuterol (VENTOLIN HFA) 108 (90 Base) MCG/ACT inhaler Inhale 1-2 puffs into the lungs every 6 (six) hours as needed for wheezing or shortness of breath. 03/28/15   Armandina StammerNwoko, Agnes I, NP  asenapine (SAPHRIS) 5 MG SUBL 24 hr tablet Place 2 tablets (10 mg total) under the tongue at bedtime. For mood control 03/28/15   Armandina StammerNwoko, Agnes I, NP  carbamazepine (TEGRETOL XR) 100 MG 12 hr tablet Take 1 tablet (100 mg total) by mouth 2 (two) times daily. For mood stabilization 03/28/15   Armandina StammerNwoko, Agnes I, NP  FLUoxetine (PROZAC) 10 MG capsule Take 1 capsule (10 mg total) by mouth daily. For depression 03/28/15   Armandina StammerNwoko, Agnes I, NP  hydrOXYzine (ATARAX/VISTARIL) 25 MG tablet Take 1  tablet (25 mg total) by mouth 3 (three) times daily as needed for anxiety. 03/28/15   Armandina StammerNwoko, Agnes I, NP  traZODone (DESYREL) 100 MG tablet Take 1 tablet (100 mg total) by mouth at bedtime as needed for sleep. 03/28/15   Sanjuana KavaNwoko, Agnes I, NP    Family History No family history on file.  Social History Social History   Tobacco Use  . Smoking status: Current Every Day Smoker    Packs/day: 1.00  . Smokeless tobacco: Never Used  Substance Use Topics  . Alcohol use: No  . Drug use: Yes    Types: Marijuana     Allergies   Abilify [aripiprazole]; Depakote er [divalproex sodium er]; Seroquel [quetiapine]; and Wellbutrin [bupropion]   Review of Systems Review of Systems  All other systems reviewed and are negative.    Physical Exam Updated Vital Signs BP 133/81 (BP Location: Left Arm)   Pulse (!) 111   Temp 99.7 F (37.6 C) (Oral)   Resp 18   Ht 5\' 7"  (1.702 m)   Wt 89.8 kg (198 lb)   SpO2 99%   BMI 31.01 kg/m   Physical Exam  Constitutional: He is oriented to person, place, and time. He appears well-developed and well-nourished.  HENT:  Head: Normocephalic and atraumatic.  Eyes: Pupils are equal, round, and reactive to light. Conjunctivae are normal.  Right eye exhibits no discharge. Left eye exhibits no discharge. No scleral icterus.  Neck: Normal range of motion. No JVD present. No tracheal deviation present.  Pulmonary/Chest: Effort normal. No stridor.  Genitourinary:  Genitourinary Comments: Circumcised penis no discharge noted, bilateral descended testicles tenderness to deep palpation of the posterior superior aspect of the right testicle, no masses noted, left testicle nontender no masses noted-no hernias noted abdomen and pelvis nontender  Neurological: He is alert and oriented to person, place, and time. Coordination normal.  Psychiatric: He has a normal mood and affect. His behavior is normal. Judgment and thought content normal.  Nursing note and vitals  reviewed.    ED Treatments / Results  Labs (all labs ordered are listed, but only abnormal results are displayed) Labs Reviewed  URINALYSIS, ROUTINE W REFLEX MICROSCOPIC - Abnormal; Notable for the following components:      Result Value   Hgb urine dipstick SMALL (*)    Leukocytes, UA MODERATE (*)    Squamous Epithelial / LPF 0-5 (*)    All other components within normal limits  GC/CHLAMYDIA PROBE AMP ([place]) NOT AT [facility]    EKG None  Radiology US Scrotum W/doppler  Result Date: 06/18/2017 CLINICAL DATA:  Right testicle pain for 3-4 days EXAM: SCROTAL ULTRASOUND DOPPLER ULTRASOUND OF THE TESTICLES TECHNIQUE: Complete ultrasound examination of the testicles, epididymis, and other scrotal structures was performed. Color and spectral Doppler ultrasound were also utilized to evaluate blood flow to the testicles. COMPARISON:  None. FINDINGS: Right testicle Measurements: 5.4 x 3.2 x 3.5 cm. No mass or microlithiasis visualized. Left testicle Measurements: 4.9 x 2.6 x 3.8 cm. Hyperechoic structure within the left testis parenchyma measures 3 x 2 x 2 mm. Right epididymis:  Normal in size and appearance. Left epididymis:  Normal in size and appearance. Hydrocele:  Bilateral, right greater than left. Varicocele:  Left varicocele. Pulsed Doppler interrogation of both testes demonstrates normal low resistance arterial and venous waveforms bilaterally. IMPRESSION: 1. No evidence for testicular torsion. 2. There is a focal hyperechoic structure in the left testis measuring 3 mm. Indeterminate. This may represent a benign testicular lipoma. Other benign and malignant neoplasms of the testis are not excluded. Recommend urologic consultation. If further imaging is clinically indicated MRI may be helpful to confirm presence of macroscopic fat. 3. Bilateral hydroceles, right greater in left. 4. Left varicocele. Electronically Signed   By: Signa Kell M.D.   On: 06/18/2017 12:54     Procedures Procedures (including critical care time)  Medications Ordered in ED Medications  cefTRIAXone (ROCEPHIN) injection 250 mg (250 mg Intramuscular Given 06/18/17 1334)  azithromycin (ZITHROMAX) tablet 1,000 mg (1,000 mg Oral Given 06/18/17 1334)  sterile water (preservative free) injection (1.2 mLs  Given 06/18/17 1334)     Initial Impression / Assessment and Plan / ED Course  I have reviewed the triage vital signs and the nursing notes.  Pertinent labs & imaging results that were available during my care of the patient were reviewed by me and considered in my medical decision making (see chart for details).    Final Clinical Impressions(s) / ED Diagnoses   Final diagnoses:  Testicle pain    Labs: UA, US Scrotum   Imaging:  Consults:  Therapeutics:  Discharge Meds:   Assessment/Plan: 32 year old male presents today with testicular pain.  This is right-sided, he has no significant findings on ultrasound on the right testicle, incidental finding on the left testicle that was discussed with urology.  Patient will  follow-up in 3 weeks for repeat ultrasound.  He was given azithromycin and ceftriaxone to cover for STDs given his complaint and exposure history.  Patient will return if symptoms worsen.  Patient given strict return precautions, he verbalized understanding and agreement to this plan.    ED Discharge Orders    None       Eyvonne Mechanic, PA-C 06/18/17 1400    Pricilla Loveless, MD 06/18/17 2256

## 2017-06-18 NOTE — ED Notes (Signed)
Pt to ultrasound

## 2017-06-18 NOTE — ED Provider Notes (Signed)
Patient placed in Quick Look pathway, seen and evaluated   Chief Complaint: Right testicle pain.  HPI:   Patient reports right testicle pain for 4 days without swelling or erythema.  Patient reports approximately 1 year ago he had a torsion of the right testicle.  Patient reports he had a tactile fever yesterday and has felt unwell.  No dysuria.  Patient does have concerns about STI exposure.  ROS: Chills, no dysuri  Physical Exam:   Gen: No distress  Neuro: Awake and Alert  Skin: Warm    Focused Exam: Examination performed with RN chaperone, Redmond BasemanHayden present.  Cremasteric reflex is intact of the right testicle.  Left testicle lies transverse.  No tenderness palpation of left testicle.  Right tenderness to palpation of epididymal region.   Initiation of care has begun. The patient has been counseled on the process, plan, and necessity for staying for the completion/evaluation, and the remainder of the medical screening examination  UA and testicular ultrasound orders placed at this time.   Elisha PonderMurray, Tajanay Hurley B, PA-C 06/18/17 1247    Shaune PollackIsaacs, Cameron, MD 06/20/17 [phone]

## 2017-06-18 NOTE — Discharge Instructions (Addendum)
Please read attached information. If you experience any new or worsening signs or symptoms please return to the emergency room for evaluation. Please follow-up with your primary care provider or specialist as discussed.  °

## 2017-06-19 LAB — GC/CHLAMYDIA PROBE AMP (~~LOC~~) NOT AT ARMC
CHLAMYDIA, DNA PROBE: NEGATIVE
Neisseria Gonorrhea: NEGATIVE

## 2018-09-02 ENCOUNTER — Encounter: Payer: Self-pay | Admitting: Emergency Medicine

## 2018-09-02 ENCOUNTER — Other Ambulatory Visit: Payer: Self-pay

## 2018-09-02 ENCOUNTER — Emergency Department
Admission: EM | Admit: 2018-09-02 | Discharge: 2018-09-02 | Disposition: A | Discharge status: Home / Self Care | Payer: Medicaid Other | Attending: Emergency Medicine | Admitting: Emergency Medicine

## 2018-09-02 ENCOUNTER — Emergency Department: Payer: Medicaid Other

## 2018-09-02 DIAGNOSIS — Z20828 Contact with and (suspected) exposure to other viral communicable diseases: Secondary | ICD-10-CM | POA: Diagnosis not present

## 2018-09-02 DIAGNOSIS — F17210 Nicotine dependence, cigarettes, uncomplicated: Secondary | ICD-10-CM | POA: Diagnosis not present

## 2018-09-02 DIAGNOSIS — R05 Cough: Secondary | ICD-10-CM | POA: Diagnosis present

## 2018-09-02 DIAGNOSIS — J069 Acute upper respiratory infection, unspecified: Secondary | ICD-10-CM | POA: Diagnosis not present

## 2018-09-02 DIAGNOSIS — Z79899 Other long term (current) drug therapy: Secondary | ICD-10-CM | POA: Insufficient documentation

## 2018-09-02 DIAGNOSIS — B9789 Other viral agents as the cause of diseases classified elsewhere: Secondary | ICD-10-CM | POA: Insufficient documentation

## 2018-09-02 LAB — GROUP A STREP BY PCR: Group A Strep by PCR: NOT DETECTED

## 2018-09-02 LAB — SARS CORONAVIRUS 2 BY RT PCR (HOSPITAL ORDER, PERFORMED IN ~~LOC~~ HOSPITAL LAB): SARS Coronavirus 2: NEGATIVE

## 2018-09-02 MED ORDER — LIDOCAINE VISCOUS HCL 2 % MT SOLN: 5.0000 mL | Freq: Four times a day (QID) | OROMUCOSAL | 0 refills | Status: DC | PRN

## 2018-09-02 MED ORDER — PSEUDOEPH-BROMPHEN-DM 30-2-10 MG/5ML PO SYRP: 5.0000 mL | ORAL_SOLUTION | Freq: Four times a day (QID) | ORAL | 0 refills | Status: DC | PRN

## 2018-09-02 MED ORDER — IBUPROFEN 600 MG PO TABS: 600.0000 mg | ORAL_TABLET | Freq: Three times a day (TID) | ORAL | 0 refills | Status: DC | PRN

## 2018-09-02 NOTE — ED Triage Notes (Signed)
Pt c/o sore throat with bodyaches, cough for the past 3 days. Works for [employer]

## 2018-09-02 NOTE — ED Notes (Addendum)
See triage note  States he developed sore throat and subjective fever for the past 3 days   Afebrile at present  Having generalized body aches and headache Unsure if he has been exposed to [employer] he works at [employer]

## 2018-09-02 NOTE — ED Triage Notes (Signed)
C/O sore throat, non productive cough, fever x 3 days.  Has been using albuterol with some relief.  States he feels like he cannot take a deep breath.  AAOx3.  Skin warm and dry. NAD.  No SOB/ DOE.  Voice clear and strong.

## 2018-09-02 NOTE — ED Provider Notes (Signed)
[facility] Emergency Department Provider Note   ____________________________________________   First MD Initiated Contact with Patient 09/02/18 1818     (approximate)  I have reviewed the triage vital signs and the nursing notes.   HISTORY  Chief Complaint Cough and Fever    HPI Christopher [name] is a 33 y.o. male patient complain of sore throat, nonproductive cough, and fever for 3 days.  Patient states has a tightness in his chest.  Patient also states that he has been exposed to coronavirus at his workplace.  Patient works for [employer].  Denies vomiting or diarrhea.  Patient rates his pain as a 5/10.  Patient describes pain as "achy".  No palliative measure for complaint.      Past Medical History:  Diagnosis Date  . Asthma   . Bipolar 1 disorder [facility](HCC)     Patient Active Problem List   Diagnosis Date Noted  . Bipolar I disorder, most recent episode mixed (HCC) 03/27/2015  . Cannabis use disorder, moderate, dependence (HCC) 03/25/2015  . Anxiety     Past Surgical History:  Procedure Laterality Date  . COSMETIC SURGERY     face  . GANGLION CYST EXCISION    . HERNIA REPAIR      Prior to Admission medications   Medication Sig Start Date End Date Taking? Authorizing Provider  albuterol (VENTOLIN HFA) 108 (90 Base) MCG/ACT inhaler Inhale 1-2 puffs into the lungs every 6 (six) hours as needed for wheezing or shortness of breath. 03/28/15   Armandina StammerNwoko, Agnes I, NP  asenapine (SAPHRIS) 5 MG SUBL 24 hr tablet Place 2 tablets (10 mg total) under the tongue at bedtime. For mood control 03/28/15   Armandina StammerNwoko, Agnes I, NP  brompheniramine-pseudoephedrine-DM 30-2-10 MG/5ML syrup Take 5 mLs by mouth 4 (four) times daily as needed. 09/02/18   Joni ReiningSmith, Ronald K, PA-C  carbamazepine (TEGRETOL XR) 100 MG 12 hr tablet Take 1 tablet (100 mg total) by mouth 2 (two) times daily. For mood stabilization 03/28/15   Armandina StammerNwoko, Agnes I, NP  FLUoxetine (PROZAC) 10 MG capsule Take 1 capsule (10  mg total) by mouth daily. For depression 03/28/15   Armandina StammerNwoko, Agnes I, NP  hydrOXYzine (ATARAX/VISTARIL) 25 MG tablet Take 1 tablet (25 mg total) by mouth 3 (three) times daily as needed for anxiety. 03/28/15   Armandina StammerNwoko, Agnes I, NP  ibuprofen (ADVIL) 600 MG tablet Take 1 tablet (600 mg total) by mouth every 8 (eight) hours as needed. 09/02/18   Joni ReiningSmith, Ronald K, PA-C  lidocaine (XYLOCAINE) 2 % solution Use as directed 5 mLs in the mouth or throat every 6 (six) hours as needed for mouth pain. 09/02/18   Joni ReiningSmith, Ronald K, PA-C  traZODone (DESYREL) 100 MG tablet Take 1 tablet (100 mg total) by mouth at bedtime as needed for sleep. 03/28/15   Armandina StammerNwoko, Agnes I, NP    Allergies Abilify [aripiprazole], Depakote er [divalproex sodium er], Seroquel [quetiapine], and Wellbutrin [bupropion]  No family history on file.  Social History Social History   Tobacco Use  . Smoking status: Current Every Day Smoker    Packs/day: 1.00  . Smokeless tobacco: Never Used  Substance Use Topics  . Alcohol use: No  . Drug use: Yes    Types: Marijuana    Review of Systems Constitutional: No fever/chills Eyes: No visual changes. ENT: No sore throat. Cardiovascular: Denies chest pain. Respiratory: Denies shortness of breath. Gastrointestinal: No abdominal pain.  No nausea, no vomiting.  No diarrhea.  No constipation.  Genitourinary: Negative for dysuria. Musculoskeletal: Negative for back pain. Skin: Negative for rash. Neurological: Negative for headaches, focal weakness or numbness. Psychiatric:  Anxiety and bipolar.     Allergic/Immunilogical: Anxi medication list.  ____________________________________________   PHYSICAL EXAM:  VITAL SIGNS: ED Triage Vitals  Enc Vitals Group     BP 09/02/18 1735 127/74     Pulse Rate 09/02/18 1735 75     Resp 09/02/18 1735 20     Temp 09/02/18 1735 98.4 F (36.9 C)     Temp Source 09/02/18 1735 Oral     SpO2 09/02/18 1735 97 %     Weight 09/02/18 1736 175 lb (79.4 kg)      Height 09/02/18 1736 5\' 7"  (1.702 m)     Head Circumference --      Peak Flow --      Pain Score 09/02/18 1736 5     Pain Loc --      Pain Edu? --      Excl. in GC? --     Constitutional: Alert and oriented. Well appearing and in no acute distress. Eyes: Conjunctivae are normal. PERRL. EOMI. Head: Atraumatic. Nose: Edematous nasal turbinates clear rhinorrhea. Mouth/Throat: Mucous membranes are moist.  Oropharynx non-erythematous. Neck: No stridor. No cervical spine tenderness to palpation.Hematological/Lymphatic/Immunilogical: No cervical lymphadenopathy. Cardiovascular: Normal rate, regular rhythm. Grossly normal heart sounds.  Good peripheral circulation. Respiratory: Normal respiratory effort.  No retractions. Lungs CTAB. Gastrointestinal: Soft and nontender. No distention. No abdominal bruits. No CVA tenderness. Musculoskeletal: No lower extremity tenderness nor edema.  No joint effusions. Neurologic:  Normal speech and language. No gross focal neurologic deficits are appreciated. No gait instability. Skin:  Skin is warm, dry and intact. No rash noted. Psychiatric: Mood and affect are normal. Speech and behavior are normal.  ____________________________________________   LABS (all labs ordered are listed, but only abnormal results are displayed)  Labs Reviewed  GROUP A STREP BY PCR  SARS CORONAVIRUS 2 (HOSPITAL ORDER, PERFORMED IN [facility])   ____________________________________________  EKG   ____________________________________________  RADIOLOGY  ED MD interpretation:    Official radiology report(s): Dg Chest 2 View  Result Date: 09/02/2018 CLINICAL DATA:  Cough and fever for 3 days. Sore throat. EXAM: CHEST - 2 VIEW COMPARISON:  03/25/2015 FINDINGS: The cardiomediastinal contours are normal. The lungs are clear. Pulmonary vasculature is normal. No consolidation, pleural effusion, or pneumothorax. No acute osseous abnormalities are seen.  IMPRESSION: No acute pulmonary process. Electronically Signed   By: Narda RutherfordMelanie  Sanford M.D.   On: 09/02/2018 19:23    ____________________________________________   PROCEDURES  Procedure(s) performed (including Critical Care):  Procedures   ____________________________________________   INITIAL IMPRESSION / ASSESSMENT AND PLAN / ED COURSE  As part of my medical decision making, I reviewed the following data within the electronic MEDICAL RECORD NUMBER         Patient presents with fever and cough for 3 days.  Patient states exposure to coronavirus.  Patient will be evaluated with chest x-ray and labs.      ____________________________________________   FINAL CLINICAL IMPRESSION(S) / ED DIAGNOSES  Final diagnoses:  Viral URI with cough     ED Discharge Orders         Ordered    brompheniramine-pseudoephedrine-DM 30-2-10 MG/5ML syrup  4 times daily PRN     09/02/18 2019    lidocaine (XYLOCAINE) 2 % solution  Every 6 hours PRN     09/02/18 2019    ibuprofen (ADVIL) 600  MG tablet  Every 8 hours PRN     09/02/18 2019           Note:  This document was prepared using Dragon voice recognition software and may include unintentional dictation errors.    Sable Feil, PA-C 09/02/18 2024    Duffy Bruce, MD 09/03/18 [phone]

## 2018-11-20 ENCOUNTER — Emergency Department: Payer: Medicaid Other

## 2018-11-20 ENCOUNTER — Emergency Department
Admission: EM | Admit: 2018-11-20 | Discharge: 2018-11-20 | Disposition: A | Discharge status: Home / Self Care | Payer: Medicaid Other | Attending: Emergency Medicine | Admitting: Emergency Medicine

## 2018-11-20 ENCOUNTER — Encounter: Payer: Self-pay | Admitting: Emergency Medicine

## 2018-11-20 ENCOUNTER — Other Ambulatory Visit: Payer: Self-pay

## 2018-11-20 DIAGNOSIS — F17210 Nicotine dependence, cigarettes, uncomplicated: Secondary | ICD-10-CM | POA: Diagnosis not present

## 2018-11-20 DIAGNOSIS — W540XXA Bitten by dog, initial encounter: Secondary | ICD-10-CM | POA: Insufficient documentation

## 2018-11-20 DIAGNOSIS — Y92018 Other place in single-family (private) house as the place of occurrence of the external cause: Secondary | ICD-10-CM | POA: Diagnosis not present

## 2018-11-20 DIAGNOSIS — Y93K9 Activity, other involving animal care: Secondary | ICD-10-CM | POA: Insufficient documentation

## 2018-11-20 DIAGNOSIS — Z23 Encounter for immunization: Secondary | ICD-10-CM | POA: Insufficient documentation

## 2018-11-20 DIAGNOSIS — Y999 Unspecified external cause status: Secondary | ICD-10-CM | POA: Diagnosis not present

## 2018-11-20 DIAGNOSIS — S51031A Puncture wound without foreign body of right elbow, initial encounter: Secondary | ICD-10-CM | POA: Insufficient documentation

## 2018-11-20 DIAGNOSIS — J45909 Unspecified asthma, uncomplicated: Secondary | ICD-10-CM | POA: Insufficient documentation

## 2018-11-20 MED ORDER — AMOXICILLIN-POT CLAVULANATE 875-125 MG PO TABS: 1.0000 | ORAL_TABLET | Freq: Two times a day (BID) | ORAL | 0 refills | Status: AC

## 2018-11-20 MED ORDER — TETANUS-DIPHTH-ACELL PERTUSSIS 5-2.5-18.5 LF-MCG/0.5 IM SUSP
0.5000 mL | Freq: Once | INTRAMUSCULAR | Status: AC
  Administered 2018-11-20: 13:00:00 0.5 mL via INTRAMUSCULAR
  Filled 2018-11-20: qty 0.5

## 2018-11-20 NOTE — ED Provider Notes (Signed)
[facility] Emergency Department Provider Note  ____________________________________________   First MD Initiated Contact with Patient 11/20/18 1210     (approximate)  I have reviewed the triage vital signs and the nursing notes.   HISTORY  Chief Complaint Animal Bite    HPI Christopher [name] is a 33 y.o. male since emergency department complaining of a dog bite to the right arm.  His dog's immunizations are up-to-date.  He states that 1 dog is nursing in the other dog tried to get in the pen.  He was trying to pull her back when she turned and bit him on the arm.  He denies any other injury.  Unsure of last Tdap.    Past Medical History:  Diagnosis Date  . Asthma   . Bipolar 1 disorder [facility])     Patient Active Problem List   Diagnosis Date Noted  . Bipolar I disorder, most recent episode mixed (Christopher [name]) 03/27/2015  . Cannabis use disorder, moderate, dependence ([place]) 03/25/2015  . Anxiety     Past Surgical History:  Procedure Laterality Date  . COSMETIC SURGERY     face  . GANGLION CYST EXCISION    . HERNIA REPAIR      Prior to Admission medications   Medication Sig Start Date End Date Taking? Authorizing Provider  albuterol (VENTOLIN HFA) 108 (90 Base) MCG/ACT inhaler Inhale 1-2 puffs into the lungs every 6 (six) hours as needed for wheezing or shortness of breath. 03/28/15   Lindell Spar I, NP  amoxicillin-clavulanate (AUGMENTIN) 875-125 MG tablet Take 1 tablet by mouth 2 (two) times daily for 7 days. 11/20/18 11/27/18  Ettamae Barkett, Linden Dolin, PA-C  asenapine (SAPHRIS) 5 MG SUBL 24 hr tablet Place 2 tablets (10 mg total) under the tongue at bedtime. For mood control 03/28/15   Lindell Spar I, NP  carbamazepine (TEGRETOL XR) 100 MG 12 hr tablet Take 1 tablet (100 mg total) by mouth 2 (two) times daily. For mood stabilization 03/28/15   Lindell Spar I, NP  FLUoxetine (PROZAC) 10 MG capsule Take 1 capsule (10 mg total) by mouth daily. For depression 03/28/15    Lindell Spar I, NP  hydrOXYzine (ATARAX/VISTARIL) 25 MG tablet Take 1 tablet (25 mg total) by mouth 3 (three) times daily as needed for anxiety. 03/28/15   Lindell Spar I, NP  traZODone (DESYREL) 100 MG tablet Take 1 tablet (100 mg total) by mouth at bedtime as needed for sleep. 03/28/15   Lindell Spar I, NP    Allergies Abilify [aripiprazole], Depakote er [divalproex sodium er], Seroquel [quetiapine], and Wellbutrin [bupropion]  No family history on file.  Social History Social History   Tobacco Use  . Smoking status: Current Every Day Smoker    Packs/day: 1.00  . Smokeless tobacco: Never Used  Substance Use Topics  . Alcohol use: No  . Drug use: Yes    Types: Marijuana    Review of Systems  Constitutional: No fever/chills Eyes: No visual changes. ENT: No sore throat. Respiratory: Denies cough Genitourinary: Negative for dysuria. Musculoskeletal: Negative for back pain.  Dog bite to the right forearm/elbow Skin: Negative for rash.    ____________________________________________   PHYSICAL EXAM:  VITAL SIGNS: ED Triage Vitals  Enc Vitals Group     BP 11/20/18 1147 120/75     Pulse Rate 11/20/18 1147 86     Resp 11/20/18 1147 18     Temp 11/20/18 1147 98.2 F (36.8 C)     Temp Source 11/20/18 1147  Oral     SpO2 11/20/18 1147 92 %     Weight 11/20/18 1145 175 lb (79.4 kg)     Height 11/20/18 1145 5\' 7"  (1.702 m)     Head Circumference --      Peak Flow --      Pain Score 11/20/18 1145 6     Pain Loc --      Pain Edu? --      Excl. in GC? --     Constitutional: Alert and oriented. Well appearing and in no acute distress. Eyes: Conjunctivae are normal.  Head: Atraumatic. Nose: No congestion/rhinnorhea. Mouth/Throat: Mucous membranes are moist.   Neck:  supple no lymphadenopathy noted Cardiovascular: Normal rate, regular rhythm.  Respiratory: Normal respiratory effort.  No retractions GU: deferred Musculoskeletal: FROM all extremities, warm and well  perfused, puncture wounds noted at the right elbow, indention of other teeth noted at the right elbow, no foreign body noted Neurologic:  Normal speech and language.  Skin:  Skin is warm, dry . No rash noted. Psychiatric: Mood and affect are normal. Speech and behavior are normal.  ____________________________________________   LABS (all labs ordered are listed, but only abnormal results are displayed)  Labs Reviewed - No data to display ____________________________________________   ____________________________________________  RADIOLOGY  X-ray of the right elbow does not show foreign body but does show gas pattern at the area of the dog bite  ____________________________________________   PROCEDURES  Procedure(s) performed: Area was cleaned and dressing was applied   Procedures    ____________________________________________   INITIAL IMPRESSION / ASSESSMENT AND PLAN / ED COURSE  Pertinent labs & imaging results that were available during my care of the patient were reviewed by me and considered in my medical decision making (see chart for details).   Patient is a 33 year old male presents emergency department with dog bite from his own dog.  Physical exam shows 2 large puncture wounds at the right elbow along with impressions of other teeth.  X-ray of the right elbow shows no foreign body but does show gas pattern.  Explained findings to the patient.  He was given a Tdap and a prescription for Augmentin.  Dressing was applied by nursing staff.  He was encouraged to return if worsening.  He is given a work note in case discharged stable condition.    Christopher [name] was evaluated in Emergency Department on 11/20/2018 for the symptoms described in the history of present illness. He was evaluated in the context of the global COVID-19 pandemic, which necessitated consideration that the patient might be at risk for infection with the SARS-CoV-2 virus that causes COVID-19.  Institutional protocols and algorithms that pertain to the evaluation of patients at risk for COVID-19 are in a state of rapid change based on information released by regulatory bodies including the CDC and federal and state organizations. These policies and algorithms were followed during the patient's care in the ED.   As part of my medical decision making, I reviewed the following data within the electronic MEDICAL RECORD NUMBER Nursing notes reviewed and incorporated, Old chart reviewed, Radiograph reviewed x-ray of the right elbow is negative, Notes from prior ED visits and [place] Controlled Substance Database  ____________________________________________   FINAL CLINICAL IMPRESSION(S) / ED DIAGNOSES  Final diagnoses:  Dog bite, initial encounter      NEW MEDICATIONS STARTED DURING THIS VISIT:  New Prescriptions   AMOXICILLIN-CLAVULANATE (AUGMENTIN) 875-125 MG TABLET    Take 1 tablet by mouth 2 (two)  times daily for 7 days.     Note:  This document was prepared using Dragon voice recognition software and may include unintentional dictation errors.    Faythe GheeFisher, Chelsa Stout W, PA-C 11/20/18 1407    Sharman CheekStafford, Phillip, MD 11/20/18 [phone]

## 2018-11-20 NOTE — ED Triage Notes (Signed)
PT arrives post animal bite to pt's right arm from pt's own dog.

## 2018-11-20 NOTE — Discharge Instructions (Addendum)
Clean the area with soap and water daily.  Return emergency department worsening.  Keep the area covered.  Take the antibiotic as prescribed.  If your iron begins to feel like rice crispies "please return emergency department immediately

## 2019-09-14 ENCOUNTER — Emergency Department: Payer: Medicaid Other

## 2019-09-14 ENCOUNTER — Other Ambulatory Visit: Payer: Self-pay

## 2019-09-14 ENCOUNTER — Emergency Department
Admission: EM | Admit: 2019-09-14 | Discharge: 2019-09-14 | Disposition: A | Discharge status: Home / Self Care | Payer: Medicaid Other | Attending: Emergency Medicine | Admitting: Emergency Medicine

## 2019-09-14 DIAGNOSIS — J45909 Unspecified asthma, uncomplicated: Secondary | ICD-10-CM | POA: Diagnosis not present

## 2019-09-14 DIAGNOSIS — Y990 Civilian activity done for income or pay: Secondary | ICD-10-CM | POA: Diagnosis not present

## 2019-09-14 DIAGNOSIS — S63502A Unspecified sprain of left wrist, initial encounter: Secondary | ICD-10-CM

## 2019-09-14 DIAGNOSIS — Y939 Activity, unspecified: Secondary | ICD-10-CM | POA: Diagnosis not present

## 2019-09-14 DIAGNOSIS — Y9269 Other specified industrial and construction area as the place of occurrence of the external cause: Secondary | ICD-10-CM | POA: Insufficient documentation

## 2019-09-14 DIAGNOSIS — F17210 Nicotine dependence, cigarettes, uncomplicated: Secondary | ICD-10-CM | POA: Insufficient documentation

## 2019-09-14 DIAGNOSIS — W231XXA Caught, crushed, jammed, or pinched between stationary objects, initial encounter: Secondary | ICD-10-CM | POA: Insufficient documentation

## 2019-09-14 DIAGNOSIS — Z79899 Other long term (current) drug therapy: Secondary | ICD-10-CM | POA: Insufficient documentation

## 2019-09-14 DIAGNOSIS — S6992XA Unspecified injury of left wrist, hand and finger(s), initial encounter: Secondary | ICD-10-CM | POA: Diagnosis present

## 2019-09-14 DIAGNOSIS — Y999 Unspecified external cause status: Secondary | ICD-10-CM | POA: Diagnosis not present

## 2019-09-14 MED ORDER — MELOXICAM 15 MG PO TABS: 15.0000 mg | ORAL_TABLET | Freq: Every day | ORAL | 0 refills | Status: DC

## 2019-09-14 NOTE — ED Triage Notes (Signed)
Pt fell off ladder at work around 3 feet injuring left wrist. No obvious deformity, does not want to file workmans comp. Pt denies any other injuries.

## 2019-09-14 NOTE — ED Provider Notes (Signed)
[facility] Emergency Department Provider Note  ____________________________________________  Time seen: Approximately 8:28 PM  I have reviewed the triage vital signs and the nursing notes.   HISTORY  Chief Complaint Wrist Pain    HPI Christopher [name] is a 34 y.o. male who presents the emergency department complaining of left wrist pain/injury.  Patient states that he was coming down a ladder at work when he slipped, jamming his wrist into a immovable object.  Patient continued to try to work states that the pain was limiting his range of motion.  Patient denied any deformity.  He has range of motion but doing so increases the pain.  No history of previous injury to the wrist.  No other complaints at this time.         Past Medical History:  Diagnosis Date  . Asthma   . Bipolar 1 disorder [facility])     Patient Active Problem List   Diagnosis Date Noted  . Bipolar I disorder, most recent episode mixed (HCC) 03/27/2015  . Cannabis use disorder, moderate, dependence (HCC) 03/25/2015  . Anxiety     Past Surgical History:  Procedure Laterality Date  . COSMETIC SURGERY     face  . GANGLION CYST EXCISION    . HERNIA REPAIR      Prior to Admission medications   Medication Sig Start Date End Date Taking? Authorizing Provider  albuterol (VENTOLIN HFA) 108 (90 Base) MCG/ACT inhaler Inhale 1-2 puffs into the lungs every 6 (six) hours as needed for wheezing or shortness of breath. 03/28/15   Armandina Stammer I, NP  asenapine (SAPHRIS) 5 MG SUBL 24 hr tablet Place 2 tablets (10 mg total) under the tongue at bedtime. For mood control 03/28/15   Armandina Stammer I, NP  carbamazepine (TEGRETOL XR) 100 MG 12 hr tablet Take 1 tablet (100 mg total) by mouth 2 (two) times daily. For mood stabilization 03/28/15   Armandina Stammer I, NP  FLUoxetine (PROZAC) 10 MG capsule Take 1 capsule (10 mg total) by mouth daily. For depression 03/28/15   Armandina Stammer I, NP  hydrOXYzine (ATARAX/VISTARIL)  25 MG tablet Take 1 tablet (25 mg total) by mouth 3 (three) times daily as needed for anxiety. 03/28/15   Armandina Stammer I, NP  meloxicam (MOBIC) 15 MG tablet Take 1 tablet (15 mg total) by mouth daily. 09/14/19   Olden Klauer, Delorise Royals, PA-C  traZODone (DESYREL) 100 MG tablet Take 1 tablet (100 mg total) by mouth at bedtime as needed for sleep. 03/28/15   Armandina Stammer I, NP    Allergies Abilify [aripiprazole], Depakote er [divalproex sodium er], Seroquel [quetiapine], and Wellbutrin [bupropion]  No family history on file.  Social History Social History   Tobacco Use  . Smoking status: Current Every Day Smoker    Packs/day: 1.00  . Smokeless tobacco: Never Used  Substance Use Topics  . Alcohol use: No  . Drug use: Yes    Types: Marijuana     Review of Systems  Constitutional: No fever/chills Eyes: No visual changes. No discharge ENT: No upper respiratory complaints. Cardiovascular: no chest pain. Respiratory: no cough. No SOB. Gastrointestinal: No abdominal pain.  No nausea, no vomiting.  No diarrhea.  No constipation. Musculoskeletal: Positive for left wrist injury Skin: Negative for rash, abrasions, lacerations, ecchymosis. Neurological: Negative for headaches, focal weakness or numbness. 10-point ROS otherwise negative.  ____________________________________________   PHYSICAL EXAM:  VITAL SIGNS: ED Triage Vitals  Enc Vitals Group     BP 09/14/19  1940 108/60     Pulse Rate 09/14/19 1940 97     Resp 09/14/19 1940 18     Temp 09/14/19 1940 99.2 F (37.3 C)     Temp Source 09/14/19 1940 Oral     SpO2 09/14/19 1940 97 %     Weight 09/14/19 1941 175 lb (79.4 kg)     Height 09/14/19 1941 5\' 7"  (1.702 m)     Head Circumference --      Peak Flow --      Pain Score 09/14/19 1940 8     Pain Loc --      Pain Edu? --      Excl. in GC? --      Constitutional: Alert and oriented. Well appearing and in no acute distress. Eyes: Conjunctivae are normal. PERRL. EOMI. Head:  Atraumatic. ENT:      Ears:       Nose: No congestion/rhinnorhea.      Mouth/Throat: Mucous membranes are moist.  Neck: No stridor.    Cardiovascular: Normal rate, regular rhythm. Normal S1 and S2.  Good peripheral circulation. Respiratory: Normal respiratory effort without tachypnea or retractions. Lungs CTAB. Good air entry to the bases with no decreased or absent breath sounds. Musculoskeletal: Full range of motion to all extremities. No gross deformities appreciated.  Visualization of the left wrist reveals no obvious deformity.  No ecchymosis, lesions or lacerations.  Good range of motion at this time.  Patient is tender to palpation along the distal ulna into the hyperthenar eminence.  No palpable abnormality.  Radial pulse and sensation intact to all digits.  Capillary refill less than 2 seconds all digits.  Examination of forearm and elbow is unremarkable. Neurologic:  Normal speech and language. No gross focal neurologic deficits are appreciated.  Skin:  Skin is warm, dry and intact. No rash noted. Psychiatric: Mood and affect are normal. Speech and behavior are normal. Patient exhibits appropriate insight and judgement.   ____________________________________________   LABS (all labs ordered are listed, but only abnormal results are displayed)  Labs Reviewed - No data to display ____________________________________________  EKG   ____________________________________________  RADIOLOGY I personally viewed and evaluated these images as part of my medical decision making, as well as reviewing the written report by the radiologist.  DG Wrist Complete Left  Result Date: 09/14/2019 CLINICAL DATA:  Fall from ladder, wrist pain. EXAM: LEFT WRIST - COMPLETE 3+ VIEW COMPARISON:  None. FINDINGS: There is no evidence of fracture or dislocation. There is no evidence of arthropathy or other focal bone abnormality. Soft tissues are unremarkable. IMPRESSION: Negative. Electronically Signed    By: 09/16/2019 M.D.   On: 09/14/2019 20:00    ____________________________________________    PROCEDURES  Procedure(s) performed:    Procedures    Medications - No data to display   ____________________________________________   INITIAL IMPRESSION / ASSESSMENT AND PLAN / ED COURSE  Pertinent labs & imaging results that were available during my care of the patient were reviewed by me and considered in my medical decision making (see chart for details).  Review of the [place] CSRS was performed in accordance of the [facility] prior to dispensing any controlled drugs.           Patient's diagnosis is consistent with wrist pain.  Patient presented to emergency department complaining of left wrist pain.  Patient slipped off a ladder jamming his wrist against a solid structure.  Imaging reveals no acute osseous abnormality.  No evidence of ligament  rupture.  Patient be placed on anti-inflammatories and given Velcro splint for symptom improvement.  Follow-up with orthopedics as needed..Patient is given ED precautions to return to the ED for any worsening or new symptoms.     ____________________________________________  FINAL CLINICAL IMPRESSION(S) / ED DIAGNOSES  Final diagnoses:  Sprain of left wrist, initial encounter      NEW MEDICATIONS STARTED DURING THIS VISIT:  ED Discharge Orders         Ordered    meloxicam (MOBIC) 15 MG tablet  Daily     Discontinue  Reprint     09/14/19 2033              This chart was dictated using voice recognition software/Dragon. Despite best efforts to proofread, errors can occur which can change the meaning. Any change was purely unintentional.    Lanette Hampshire 09/14/19 2033    Darci Current, MD 09/14/19 [phone]

## 2020-12-13 ENCOUNTER — Emergency Department: Payer: Medicaid Other

## 2020-12-13 ENCOUNTER — Emergency Department
Admission: EM | Admit: 2020-12-13 | Discharge: 2020-12-13 | Disposition: A | Discharge status: Home / Self Care | Payer: Medicaid Other | Attending: Emergency Medicine | Admitting: Emergency Medicine

## 2020-12-13 ENCOUNTER — Other Ambulatory Visit: Payer: Self-pay

## 2020-12-13 DIAGNOSIS — Y990 Civilian activity done for income or pay: Secondary | ICD-10-CM | POA: Diagnosis not present

## 2020-12-13 DIAGNOSIS — S46911A Strain of unspecified muscle, fascia and tendon at shoulder and upper arm level, right arm, initial encounter: Secondary | ICD-10-CM | POA: Insufficient documentation

## 2020-12-13 DIAGNOSIS — J45909 Unspecified asthma, uncomplicated: Secondary | ICD-10-CM | POA: Diagnosis not present

## 2020-12-13 DIAGNOSIS — Y9289 Other specified places as the place of occurrence of the external cause: Secondary | ICD-10-CM | POA: Diagnosis not present

## 2020-12-13 DIAGNOSIS — X509XXA Other and unspecified overexertion or strenuous movements or postures, initial encounter: Secondary | ICD-10-CM | POA: Insufficient documentation

## 2020-12-13 DIAGNOSIS — F17210 Nicotine dependence, cigarettes, uncomplicated: Secondary | ICD-10-CM | POA: Insufficient documentation

## 2020-12-13 DIAGNOSIS — S4991XA Unspecified injury of right shoulder and upper arm, initial encounter: Secondary | ICD-10-CM | POA: Diagnosis present

## 2020-12-13 MED ORDER — METHYLPREDNISOLONE 4 MG PO TBPK: ORAL_TABLET | ORAL | 0 refills | Status: AC

## 2020-12-13 MED ORDER — BACLOFEN 10 MG PO TABS: 10.0000 mg | ORAL_TABLET | Freq: Three times a day (TID) | ORAL | 0 refills | Status: AC

## 2020-12-13 NOTE — ED Provider Notes (Signed)
[facility] Emergency Department Provider Note  ____________________________________________   Event Date/Time   First MD Initiated Contact with Patient 12/13/20 [phone]     (approximate)  I have reviewed the triage vital signs and the nursing notes.   HISTORY  Chief Complaint Shoulder Pain    HPI Christopher [name] is a 35 y.o. male presents emergency department complaining of right shoulder pain.  Symptoms started on Monday.  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  Past Medical History:  Diagnosis Date   Asthma    Bipolar 1 disorder [facility])     Patient Active Problem List   Diagnosis Date Noted   Bipolar I disorder, most recent episode mixed (HCC) 03/27/2015   Cannabis use disorder, moderate, dependence (HCC) 03/25/2015   Anxiety     Past Surgical History:  Procedure Laterality Date   COSMETIC SURGERY     face   GANGLION CYST EXCISION     HERNIA REPAIR      Prior to Admission medications   Medication Sig Start Date End Date Taking? Authorizing Provider  albuterol (VENTOLIN HFA) 108 (90 Base) MCG/ACT inhaler Inhale 1-2 puffs into the lungs every 6 (six) hours as needed for wheezing or shortness of breath. 03/28/15   Armandina Stammer I, NP  asenapine (SAPHRIS) 5 MG SUBL 24 hr tablet Place 2 tablets (10 mg total) under the tongue at bedtime. For mood control 03/28/15   Armandina Stammer I, NP  carbamazepine (TEGRETOL XR) 100 MG 12 hr tablet Take 1 tablet (100 mg total) by mouth 2 (two) times daily. For mood stabilization 03/28/15   Armandina Stammer I, NP  FLUoxetine (PROZAC) 10 MG capsule Take 1 capsule (10 mg total) by mouth daily. For depression 03/28/15   Armandina Stammer I, NP  hydrOXYzine (ATARAX/VISTARIL) 25 MG tablet Take 1 tablet (25 mg total) by mouth 3 (three) times daily as needed for anxiety. 03/28/15   Armandina Stammer I, NP  meloxicam (MOBIC) 15 MG tablet Take 1 tablet (15 mg total) by mouth daily. 09/14/19   Cuthriell, Delorise Royals, PA-C   traZODone (DESYREL) 100 MG tablet Take 1 tablet (100 mg total) by mouth at bedtime as needed for sleep. 03/28/15   Armandina Stammer I, NP    Allergies Abilify [aripiprazole], Depakote er [divalproex sodium er], Seroquel [quetiapine], and Wellbutrin [bupropion]  No family history on file.  Social History Social History   Tobacco Use   Smoking status: Every Day    Packs/day: 1.00    Types: Cigarettes   Smokeless tobacco: Never  Substance Use Topics   Alcohol use: No   Drug use: Yes    Types: Marijuana    Review of Systems  Constitutional: No fever/chills Eyes: No visual changes. ENT: No sore throat. Respiratory: Denies cough Cardiovascular: Denies chest pain Gastrointestinal: Denies abdominal pain Genitourinary: Negative for dysuria. Musculoskeletal: Negative for back pain.  Positive for right shoulder pain Skin: Negative for rash. Psychiatric: no mood changes,     ____________________________________________   PHYSICAL EXAM:  VITAL SIGNS: ED Triage Vitals  Enc Vitals Group     BP 12/13/20 0707 133/83     Pulse Rate 12/13/20 0707 72     Resp 12/13/20 0707 17     Temp 12/13/20 0707 98.3 F (36.8 C)     Temp Source 12/13/20 0707 Oral     SpO2 12/13/20 0707 98 %     Weight 12/13/20 0708 165 lb (74.8 kg)  Height 12/13/20 0708 5\' 8"  (1.727 m)     Head Circumference --      Peak Flow --      Pain Score 12/13/20 0708 10     Pain Loc --      Pain Edu? --      Excl. in GC? --     Constitutional: Alert and oriented. Well appearing and in no acute distress. Eyes: Conjunctivae are normal.  Head: Atraumatic. Nose: No congestion/rhinnorhea. Mouth/Throat: Mucous membranes are moist.   Neck:  supple no lymphadenopathy noted Cardiovascular: Normal rate, regular rhythm.  Respiratory: Normal respiratory effort.  No retractions GU: deferred Musculoskeletal: FROM all extremities, warm and well perfused Neurologic:  Normal speech and language.  Skin:  Skin is warm,  dry, intact.  No rash noted. Psychiatric: Mood and affect are normal. Speech and behavior are normal.  ____________________________________________   LABS (all labs ordered are listed, but only abnormal results are displayed)  Labs Reviewed - No data to display ____________________________________________   ____________________________________________  RADIOLOGY  X-ray of the right shoulder  ____________________________________________   PROCEDURES  Procedure(s) performed: No  Procedures    ____________________________________________   INITIAL IMPRESSION / ASSESSMENT AND PLAN / ED COURSE  Pertinent labs & imaging results that were available during my care of the patient were reviewed by me and considered in my medical decision making (see chart for details).   The patient is a 35 year old male presents emergency department with right shoulder pain.  See HPI.  Physical exam shows shoulder to be tender along the scapula, joint space, neurovascular intact  X-ray of the right shoulder  X-ray of the right shoulder reviewed by me confirmed by radiology to be negative for any acute abnormality.  I feel this is a overuse injury.  Be placed on anti-inflammatory and muscle relaxer.  F/u with orthopedics if not improving in 3 days, return if worsening, RICE  Christopher [name] was evaluated in Emergency Department on 12/13/2020 for the symptoms described in the history of present illness. He was evaluated in the context of the global COVID-19 pandemic, which necessitated consideration that the patient might be at risk for infection with the SARS-CoV-2 virus that causes COVID-19. Institutional protocols and algorithms that pertain to the evaluation of patients at risk for COVID-19 are in a state of rapid change based on information released by regulatory bodies including the CDC and federal and state organizations. These policies and algorithms were followed during the patient's care in  the ED.    As part of my medical decision making, I reviewed the following data within the electronic MEDICAL RECORD NUMBER Nursing notes reviewed and incorporated, Old chart reviewed, Radiograph reviewed , Notes from prior ED visits, and [place] Controlled Substance Database  ____________________________________________   FINAL CLINICAL IMPRESSION(S) / ED DIAGNOSES  Final diagnoses:  Strain of right shoulder, initial encounter      NEW MEDICATIONS STARTED DURING THIS VISIT:  New Prescriptions   No medications on file     Note:  This document was prepared using Dragon voice recognition software and may include unintentional dictation errors.    12/15/2020, PA-C 12/13/20 12/15/20    [id number], MD 12/13/20 [phone]

## 2020-12-13 NOTE — ED Triage Notes (Signed)
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

## 2021-07-29 ENCOUNTER — Ambulatory Visit
Admission: RE | Admit: 2021-07-29 | Discharge: 2021-07-29 | Disposition: A | Discharge status: Home / Self Care | Payer: Worker's Compensation | Source: Ambulatory Visit | Attending: Nurse Practitioner | Admitting: Nurse Practitioner

## 2021-07-29 ENCOUNTER — Other Ambulatory Visit: Payer: Self-pay | Admitting: Nurse Practitioner

## 2021-07-29 DIAGNOSIS — M25511 Pain in right shoulder: Secondary | ICD-10-CM

## 2023-02-19 IMAGING — CR DG SHOULDER 2+V*R*
3 series · 3 of 3 positions shown · non-contrast
Comparison: Chest radiographs 09/02/2018 and earlier.

CLINICAL DATA: 35-year-old male with right shoulder pain for
multiple days. Heavy lifting at work.

EXAM:
RIGHT SHOULDER - 2+ VIEW

[shoulder grashey]
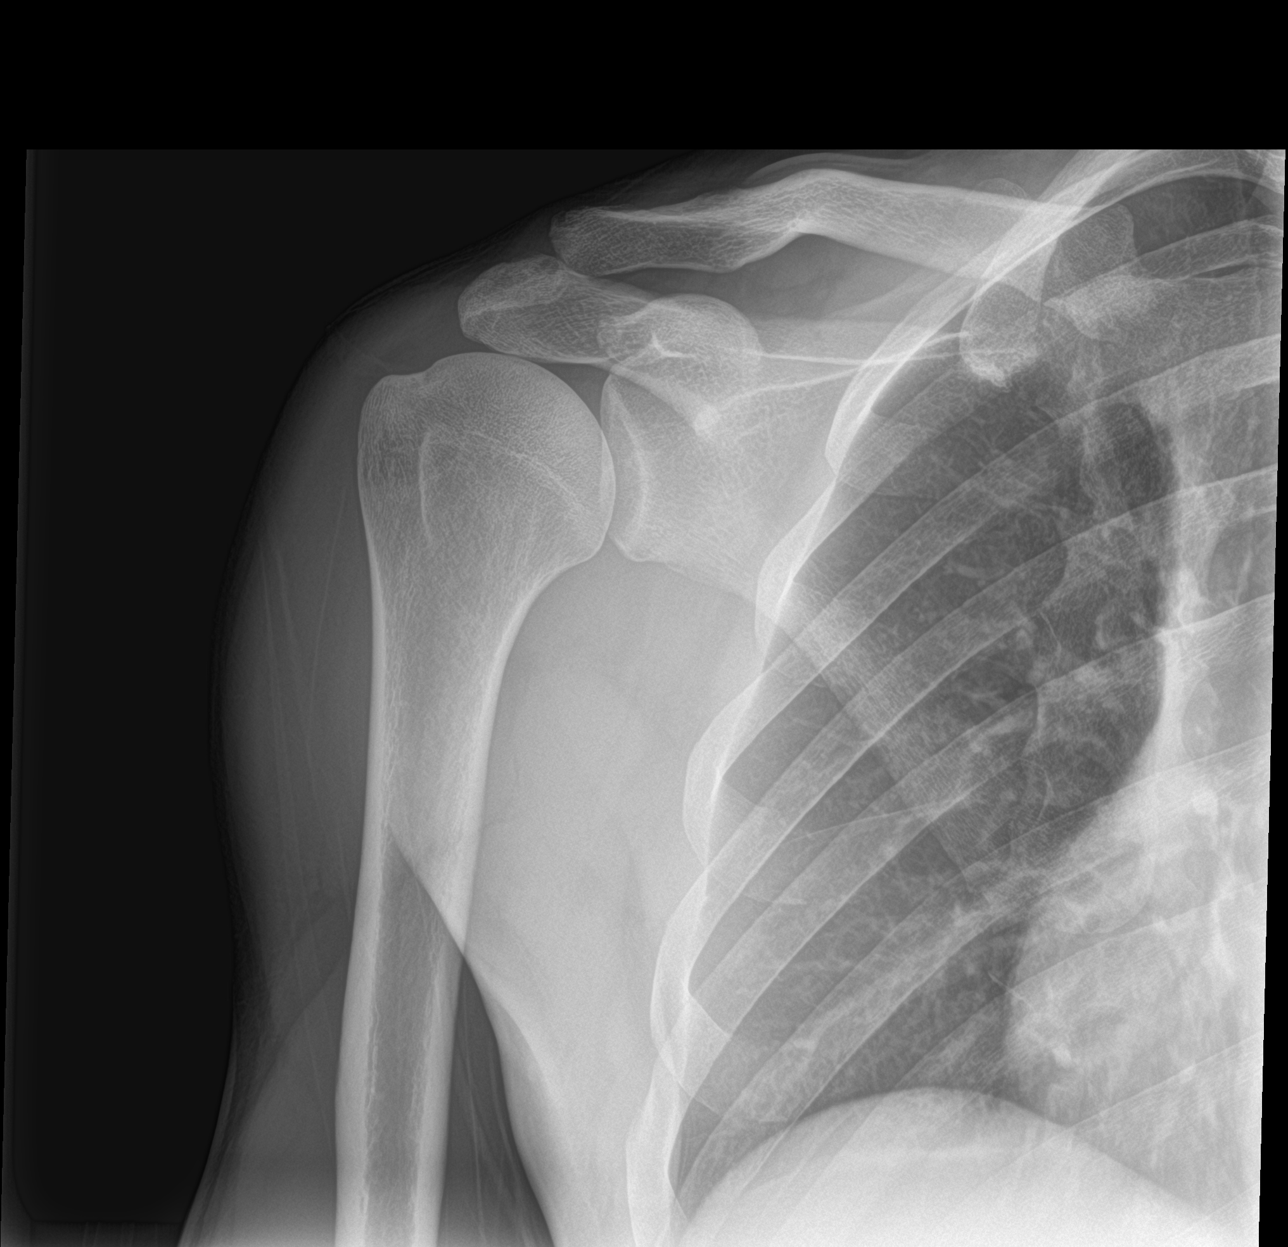

[shoulder y view]
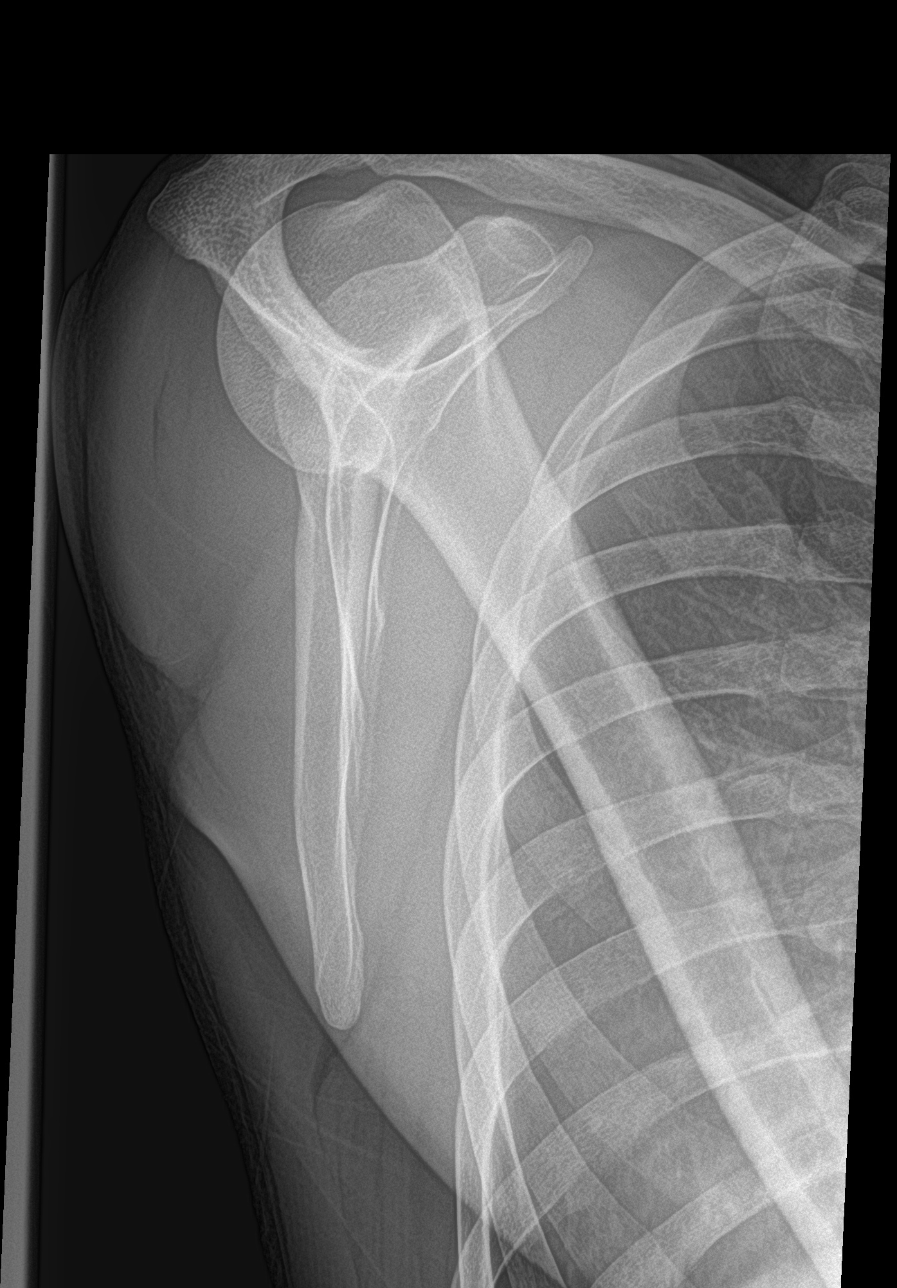

[shoulder axillary]
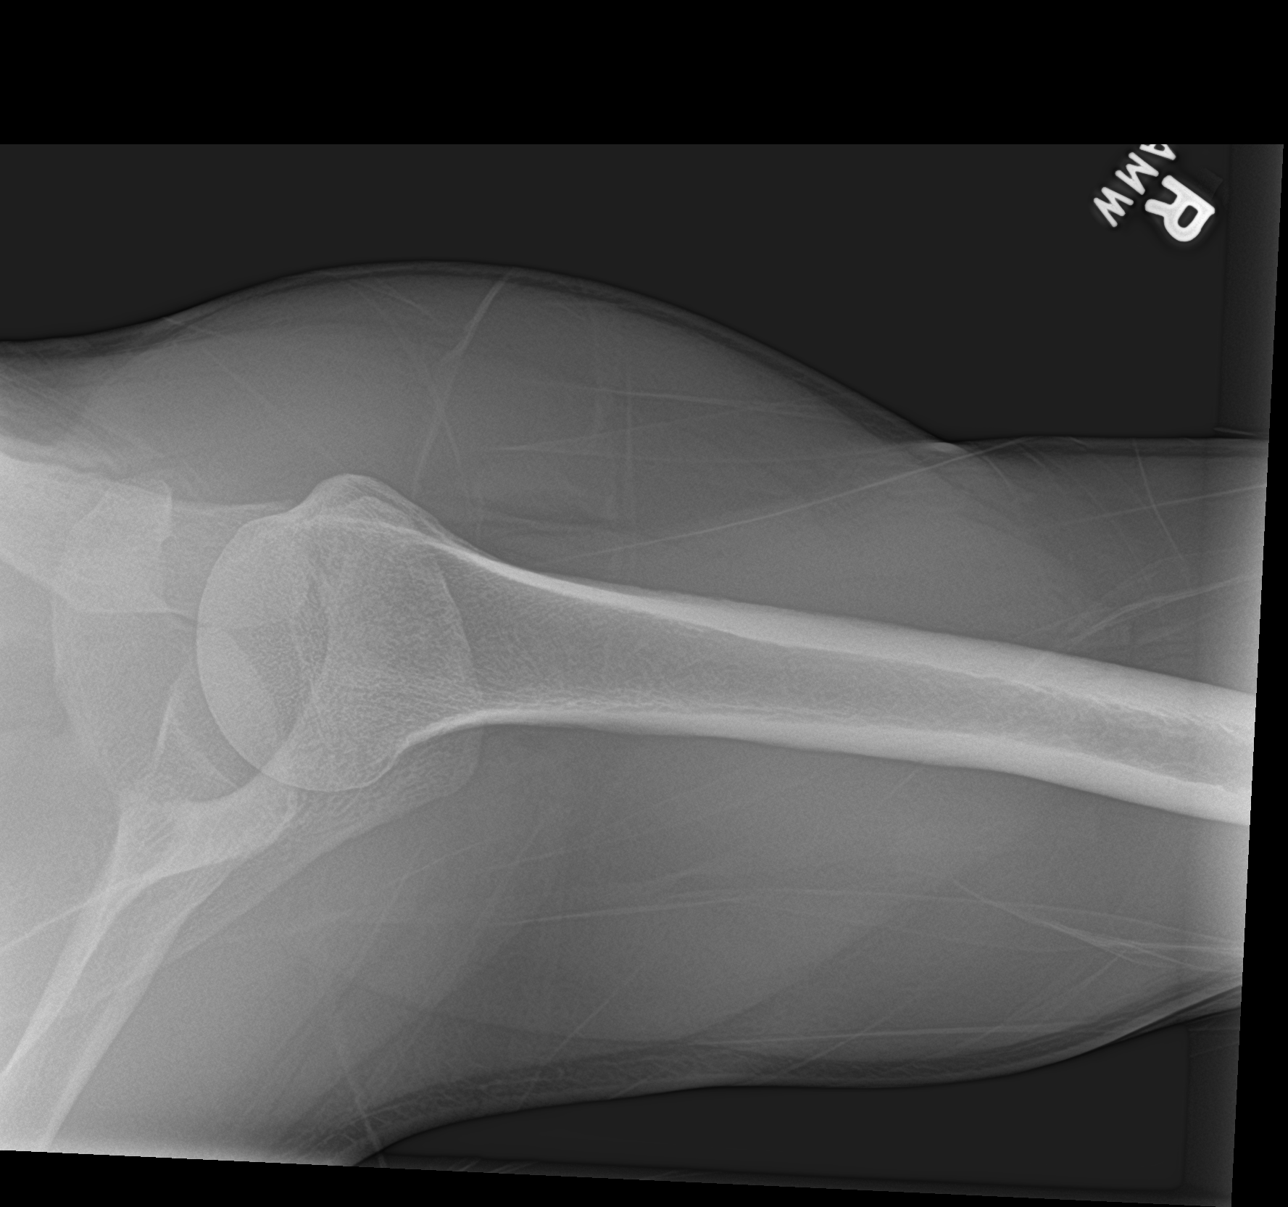

[3 of 3 positions shown; findings below may reference images not displayed]

FINDINGS: Bone mineralization is within normal limits. There is no evidence of
fracture or dislocation. There is no evidence of arthropathy or
other focal bone abnormality. Negative visible right ribs and chest.
IMPRESSION: Negative.
